# Patient Record
Sex: Male | Born: 1945 | Race: White | Hispanic: No | Smoking: Never smoker
Health system: Southern US, Community
[De-identification: ages and names within clinical notes are randomized; demographics above are authoritative.]

## PROBLEM LIST (undated history)

## (undated) DIAGNOSIS — I10 Essential (primary) hypertension: Secondary | ICD-10-CM

## (undated) DIAGNOSIS — R29818 Other symptoms and signs involving the nervous system: Secondary | ICD-10-CM

## (undated) DIAGNOSIS — Z9289 Personal history of other medical treatment: Secondary | ICD-10-CM

## (undated) DIAGNOSIS — R9431 Abnormal electrocardiogram [ECG] [EKG]: Secondary | ICD-10-CM

## (undated) DIAGNOSIS — R6889 Other general symptoms and signs: Secondary | ICD-10-CM

## (undated) DIAGNOSIS — R3129 Other microscopic hematuria: Secondary | ICD-10-CM

## (undated) DIAGNOSIS — H9319 Tinnitus, unspecified ear: Secondary | ICD-10-CM

## (undated) DIAGNOSIS — N400 Enlarged prostate without lower urinary tract symptoms: Secondary | ICD-10-CM

## (undated) DIAGNOSIS — T884XXA Failed or difficult intubation, initial encounter: Secondary | ICD-10-CM

## (undated) DIAGNOSIS — Z973 Presence of spectacles and contact lenses: Secondary | ICD-10-CM

## (undated) DIAGNOSIS — F4310 Post-traumatic stress disorder, unspecified: Secondary | ICD-10-CM

## (undated) DIAGNOSIS — E7800 Pure hypercholesterolemia, unspecified: Secondary | ICD-10-CM

## (undated) DIAGNOSIS — K219 Gastro-esophageal reflux disease without esophagitis: Secondary | ICD-10-CM

## (undated) DIAGNOSIS — F32A Depression, unspecified: Secondary | ICD-10-CM

## (undated) DIAGNOSIS — F329 Major depressive disorder, single episode, unspecified: Secondary | ICD-10-CM

## (undated) DIAGNOSIS — S069XAA Unspecified intracranial injury with loss of consciousness status unknown, initial encounter: Secondary | ICD-10-CM

## (undated) DIAGNOSIS — M1990 Unspecified osteoarthritis, unspecified site: Secondary | ICD-10-CM

## (undated) DIAGNOSIS — R351 Nocturia: Secondary | ICD-10-CM

## (undated) DIAGNOSIS — J45909 Unspecified asthma, uncomplicated: Secondary | ICD-10-CM

## (undated) DIAGNOSIS — A809 Acute poliomyelitis, unspecified: Secondary | ICD-10-CM

## (undated) DIAGNOSIS — Z85828 Personal history of other malignant neoplasm of skin: Secondary | ICD-10-CM

## (undated) DIAGNOSIS — K5792 Diverticulitis of intestine, part unspecified, without perforation or abscess without bleeding: Secondary | ICD-10-CM

## (undated) DIAGNOSIS — R972 Elevated prostate specific antigen [PSA]: Secondary | ICD-10-CM

## (undated) DIAGNOSIS — S069X9A Unspecified intracranial injury with loss of consciousness of unspecified duration, initial encounter: Secondary | ICD-10-CM

## (undated) DIAGNOSIS — I499 Cardiac arrhythmia, unspecified: Secondary | ICD-10-CM

## (undated) HISTORY — DX: Other microscopic hematuria: R31.29

## (undated) HISTORY — PX: UPPER GASTROINTESTINAL ENDOSCOPY: SHX188

## (undated) HISTORY — DX: Other symptoms and signs involving the nervous system: R29.818

## (undated) HISTORY — DX: Acute poliomyelitis, unspecified: A80.9

## (undated) HISTORY — PX: BASAL CELL CARCINOMA EXCISION: SHX1214

## (undated) HISTORY — PX: CARPAL TUNNEL RELEASE: SHX101

## (undated) HISTORY — PX: CARDIAC CATHETERIZATION: SHX172

## (undated) HISTORY — PX: COLONOSCOPY W/ BIOPSIES: SHX1374

## (undated) HISTORY — PX: TONSILLECTOMY: SUR1361

## (undated) HISTORY — PX: CARDIOVASCULAR STRESS TEST: SHX262

---

## 1983-11-12 HISTORY — PX: CERVICAL LAMINECTOMY: SHX94

## 1984-11-11 HISTORY — PX: CERVICAL FUSION: SHX112

## 1996-11-11 HISTORY — PX: CERVICAL FUSION: SHX112

## 1999-11-26 ENCOUNTER — Encounter: Admission: RE | Admit: 1999-11-26 | Discharge: 1999-11-26 | Payer: Self-pay | Admitting: Neurosurgery

## 1999-11-26 ENCOUNTER — Encounter: Payer: Self-pay | Admitting: Neurosurgery

## 2000-12-03 ENCOUNTER — Ambulatory Visit (HOSPITAL_COMMUNITY): Admission: RE | Admit: 2000-12-03 | Discharge: 2000-12-03 | Payer: Self-pay | Admitting: Interventional Cardiology

## 2006-02-14 ENCOUNTER — Ambulatory Visit (HOSPITAL_COMMUNITY): Admission: RE | Admit: 2006-02-14 | Discharge: 2006-02-14 | Payer: Self-pay | Admitting: Gastroenterology

## 2006-10-16 ENCOUNTER — Encounter: Admission: RE | Admit: 2006-10-16 | Discharge: 2006-12-15 | Payer: Self-pay | Admitting: Internal Medicine

## 2007-01-19 ENCOUNTER — Encounter: Admission: RE | Admit: 2007-01-19 | Discharge: 2007-01-19 | Payer: Self-pay | Admitting: Internal Medicine

## 2008-02-08 ENCOUNTER — Ambulatory Visit (HOSPITAL_COMMUNITY): Admission: RE | Admit: 2008-02-08 | Discharge: 2008-02-08 | Payer: Self-pay | Admitting: Internal Medicine

## 2008-02-26 ENCOUNTER — Ambulatory Visit (HOSPITAL_COMMUNITY): Admission: RE | Admit: 2008-02-26 | Discharge: 2008-02-26 | Payer: Self-pay | Admitting: Internal Medicine

## 2009-02-07 ENCOUNTER — Encounter: Admission: RE | Admit: 2009-02-07 | Discharge: 2009-02-07 | Payer: Self-pay | Admitting: Internal Medicine

## 2009-02-16 ENCOUNTER — Ambulatory Visit (HOSPITAL_COMMUNITY): Admission: RE | Admit: 2009-02-16 | Discharge: 2009-02-16 | Payer: Self-pay | Admitting: Internal Medicine

## 2009-03-27 ENCOUNTER — Ambulatory Visit: Payer: Self-pay | Admitting: Psychology
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
## 2011-05-20 ENCOUNTER — Ambulatory Visit: Payer: Self-pay

## 2011-05-20 ENCOUNTER — Other Ambulatory Visit: Payer: Self-pay | Admitting: Internal Medicine

## 2011-05-20 DIAGNOSIS — R51 Headache: Secondary | ICD-10-CM

## 2011-05-22 ENCOUNTER — Ambulatory Visit
Admission: RE | Admit: 2011-05-22 | Discharge: 2011-05-22 | Disposition: A | Discharge status: Home / Self Care | Payer: Medicare Other | Source: Ambulatory Visit | Attending: Internal Medicine | Admitting: Internal Medicine

## 2011-05-22 DIAGNOSIS — R51 Headache: Secondary | ICD-10-CM

## 2011-08-20 ENCOUNTER — Ambulatory Visit (HOSPITAL_COMMUNITY)
Admission: RE | Admit: 2011-08-20 | Discharge: 2011-08-20 | Disposition: A | Discharge status: Home / Self Care | Payer: Medicare Other | Source: Ambulatory Visit | Attending: Gastroenterology | Admitting: Gastroenterology

## 2011-08-20 DIAGNOSIS — Z981 Arthrodesis status: Secondary | ICD-10-CM | POA: Insufficient documentation

## 2011-08-20 DIAGNOSIS — K219 Gastro-esophageal reflux disease without esophagitis: Secondary | ICD-10-CM | POA: Insufficient documentation

## 2011-08-20 DIAGNOSIS — K5730 Diverticulosis of large intestine without perforation or abscess without bleeding: Secondary | ICD-10-CM | POA: Insufficient documentation

## 2011-08-20 DIAGNOSIS — I10 Essential (primary) hypertension: Secondary | ICD-10-CM | POA: Insufficient documentation

## 2011-08-20 DIAGNOSIS — Z7982 Long term (current) use of aspirin: Secondary | ICD-10-CM | POA: Insufficient documentation

## 2011-08-20 DIAGNOSIS — Z860100 Personal history of colon polyps, unspecified: Secondary | ICD-10-CM | POA: Insufficient documentation

## 2011-08-20 DIAGNOSIS — Z79899 Other long term (current) drug therapy: Secondary | ICD-10-CM | POA: Insufficient documentation

## 2011-08-20 DIAGNOSIS — N400 Enlarged prostate without lower urinary tract symptoms: Secondary | ICD-10-CM | POA: Insufficient documentation

## 2011-08-20 DIAGNOSIS — Z1211 Encounter for screening for malignant neoplasm of colon: Secondary | ICD-10-CM | POA: Insufficient documentation

## 2011-10-24 ENCOUNTER — Other Ambulatory Visit: Payer: Self-pay | Admitting: Internal Medicine

## 2011-10-24 ENCOUNTER — Ambulatory Visit
Admission: RE | Admit: 2011-10-24 | Discharge: 2011-10-24 | Disposition: A | Discharge status: Home / Self Care | Payer: Medicare Other | Source: Ambulatory Visit | Attending: Internal Medicine | Admitting: Internal Medicine

## 2011-10-24 DIAGNOSIS — I2699 Other pulmonary embolism without acute cor pulmonale: Secondary | ICD-10-CM

## 2011-10-24 MED ORDER — IOHEXOL 300 MG/ML  SOLN
125.0000 mL | Freq: Once | INTRAMUSCULAR | Status: AC | PRN
  Administered 2011-10-24: 125 mL via INTRAVENOUS

## 2011-12-26 ENCOUNTER — Other Ambulatory Visit: Payer: Self-pay | Admitting: Internal Medicine

## 2011-12-26 DIAGNOSIS — R630 Anorexia: Secondary | ICD-10-CM

## 2011-12-30 ENCOUNTER — Other Ambulatory Visit: Payer: Self-pay | Admitting: Internal Medicine

## 2011-12-30 ENCOUNTER — Ambulatory Visit
Admission: RE | Admit: 2011-12-30 | Discharge: 2011-12-30 | Disposition: A | Discharge status: Home / Self Care | Payer: Medicare Other | Source: Ambulatory Visit | Attending: Internal Medicine | Admitting: Internal Medicine

## 2011-12-30 DIAGNOSIS — D144 Benign neoplasm of respiratory system, unspecified: Secondary | ICD-10-CM

## 2011-12-30 DIAGNOSIS — R630 Anorexia: Secondary | ICD-10-CM

## 2011-12-30 MED ORDER — IOHEXOL 300 MG/ML  SOLN
100.0000 mL | Freq: Once | INTRAMUSCULAR | Status: AC | PRN
  Administered 2011-12-30: 100 mL via INTRAVENOUS

## 2012-04-29 ENCOUNTER — Ambulatory Visit
Admission: RE | Admit: 2012-04-29 | Discharge: 2012-04-29 | Disposition: A | Discharge status: Home / Self Care | Payer: Medicare Other | Source: Ambulatory Visit | Attending: Internal Medicine | Admitting: Internal Medicine

## 2012-04-29 DIAGNOSIS — D144 Benign neoplasm of respiratory system, unspecified: Secondary | ICD-10-CM

## 2013-01-27 ENCOUNTER — Encounter (HOSPITAL_BASED_OUTPATIENT_CLINIC_OR_DEPARTMENT_OTHER): Payer: Self-pay | Admitting: Anesthesiology

## 2013-01-27 ENCOUNTER — Emergency Department (HOSPITAL_COMMUNITY)
Admission: EM | Admit: 2013-01-27 | Discharge: 2013-01-27 | Disposition: A | Discharge status: Home / Self Care | Payer: Medicare Other | Attending: Emergency Medicine | Admitting: Emergency Medicine

## 2013-01-27 ENCOUNTER — Encounter (HOSPITAL_BASED_OUTPATIENT_CLINIC_OR_DEPARTMENT_OTHER): Payer: Self-pay | Admitting: *Deleted

## 2013-01-27 ENCOUNTER — Other Ambulatory Visit: Payer: Self-pay | Admitting: Orthopedic Surgery

## 2013-01-27 ENCOUNTER — Ambulatory Visit (HOSPITAL_BASED_OUTPATIENT_CLINIC_OR_DEPARTMENT_OTHER): Payer: Medicare Other | Admitting: Anesthesiology

## 2013-01-27 ENCOUNTER — Encounter (HOSPITAL_COMMUNITY): Payer: Self-pay | Admitting: Emergency Medicine

## 2013-01-27 ENCOUNTER — Encounter (HOSPITAL_BASED_OUTPATIENT_CLINIC_OR_DEPARTMENT_OTHER)
Admission: RE | Disposition: A | Discharge status: Home / Self Care | Payer: Self-pay | Source: Ambulatory Visit | Attending: Orthopedic Surgery

## 2013-01-27 ENCOUNTER — Ambulatory Visit (HOSPITAL_BASED_OUTPATIENT_CLINIC_OR_DEPARTMENT_OTHER)
Admission: RE | Admit: 2013-01-27 | Discharge: 2013-01-27 | Disposition: A | Discharge status: Home / Self Care | Payer: Medicare Other | Source: Ambulatory Visit | Attending: Orthopedic Surgery | Admitting: Orthopedic Surgery

## 2013-01-27 DIAGNOSIS — Z981 Arthrodesis status: Secondary | ICD-10-CM | POA: Insufficient documentation

## 2013-01-27 DIAGNOSIS — IMO0001 Reserved for inherently not codable concepts without codable children: Secondary | ICD-10-CM | POA: Insufficient documentation

## 2013-01-27 DIAGNOSIS — Z23 Encounter for immunization: Secondary | ICD-10-CM | POA: Insufficient documentation

## 2013-01-27 DIAGNOSIS — F3289 Other specified depressive episodes: Secondary | ICD-10-CM | POA: Insufficient documentation

## 2013-01-27 DIAGNOSIS — I10 Essential (primary) hypertension: Secondary | ICD-10-CM | POA: Insufficient documentation

## 2013-01-27 DIAGNOSIS — J45909 Unspecified asthma, uncomplicated: Secondary | ICD-10-CM | POA: Insufficient documentation

## 2013-01-27 DIAGNOSIS — S61409A Unspecified open wound of unspecified hand, initial encounter: Secondary | ICD-10-CM | POA: Insufficient documentation

## 2013-01-27 DIAGNOSIS — Z8659 Personal history of other mental and behavioral disorders: Secondary | ICD-10-CM | POA: Insufficient documentation

## 2013-01-27 DIAGNOSIS — K219 Gastro-esophageal reflux disease without esophagitis: Secondary | ICD-10-CM | POA: Insufficient documentation

## 2013-01-27 DIAGNOSIS — F329 Major depressive disorder, single episode, unspecified: Secondary | ICD-10-CM | POA: Insufficient documentation

## 2013-01-27 DIAGNOSIS — Z8709 Personal history of other diseases of the respiratory system: Secondary | ICD-10-CM | POA: Insufficient documentation

## 2013-01-27 DIAGNOSIS — Z8679 Personal history of other diseases of the circulatory system: Secondary | ICD-10-CM | POA: Insufficient documentation

## 2013-01-27 DIAGNOSIS — Z79899 Other long term (current) drug therapy: Secondary | ICD-10-CM | POA: Insufficient documentation

## 2013-01-27 DIAGNOSIS — S61209A Unspecified open wound of unspecified finger without damage to nail, initial encounter: Secondary | ICD-10-CM | POA: Insufficient documentation

## 2013-01-27 DIAGNOSIS — Z7982 Long term (current) use of aspirin: Secondary | ICD-10-CM | POA: Insufficient documentation

## 2013-01-27 HISTORY — DX: Cardiac arrhythmia, unspecified: I49.9

## 2013-01-27 HISTORY — DX: Gastro-esophageal reflux disease without esophagitis: K21.9

## 2013-01-27 HISTORY — DX: Unspecified asthma, uncomplicated: J45.909

## 2013-01-27 HISTORY — DX: Essential (primary) hypertension: I10

## 2013-01-27 HISTORY — DX: Other general symptoms and signs: R68.89

## 2013-01-27 HISTORY — DX: Presence of spectacles and contact lenses: Z97.3

## 2013-01-27 HISTORY — DX: Post-traumatic stress disorder, unspecified: F43.10

## 2013-01-27 HISTORY — DX: Major depressive disorder, single episode, unspecified: F32.9

## 2013-01-27 HISTORY — PX: INCISION AND DRAINAGE ABSCESS: SHX5864

## 2013-01-27 HISTORY — DX: Failed or difficult intubation, initial encounter: T88.4XXA

## 2013-01-27 HISTORY — DX: Depression, unspecified: F32.A

## 2013-01-27 LAB — POCT I-STAT, CHEM 8
BUN: 11 mg/dL (ref 6–23)
Calcium, Ion: 1.25 mmol/L (ref 1.13–1.30)
Hemoglobin: 15.3 g/dL (ref 13.0–17.0)
TCO2: 27 mmol/L (ref 0–100)

## 2013-01-27 SURGERY — INCISION AND DRAINAGE, ABSCESS
Anesthesia: General | Site: Thumb | Laterality: Right | Wound class: Dirty or Infected

## 2013-01-27 MED ORDER — DEXAMETHASONE SODIUM PHOSPHATE 4 MG/ML IJ SOLN
INTRAMUSCULAR | Status: DC | PRN
  Administered 2013-01-27: 10 mg via INTRAVENOUS

## 2013-01-27 MED ORDER — CHLORHEXIDINE GLUCONATE 4 % EX LIQD: 60.0000 mL | Freq: Once | CUTANEOUS | Status: DC

## 2013-01-27 MED ORDER — 0.9 % SODIUM CHLORIDE (POUR BTL) OPTIME
TOPICAL | Status: DC | PRN
  Administered 2013-01-27: 200 mL

## 2013-01-27 MED ORDER — MIDAZOLAM HCL 2 MG/2ML IJ SOLN: 1.0000 mg | INTRAMUSCULAR | Status: DC | PRN

## 2013-01-27 MED ORDER — HYDROMORPHONE HCL PF 1 MG/ML IJ SOLN: 0.2500 mg | INTRAMUSCULAR | Status: DC | PRN

## 2013-01-27 MED ORDER — LIDOCAINE HCL (CARDIAC) 20 MG/ML IV SOLN
INTRAVENOUS | Status: DC | PRN
  Administered 2013-01-27: 50 mg via INTRAVENOUS

## 2013-01-27 MED ORDER — HYDROCODONE-ACETAMINOPHEN 5-325 MG PO TABS: 1.0000 | ORAL_TABLET | Freq: Four times a day (QID) | ORAL | Status: DC | PRN

## 2013-01-27 MED ORDER — SUFENTANIL CITRATE 50 MCG/ML IV SOLN
INTRAVENOUS | Status: DC | PRN
  Administered 2013-01-27: 10 ug via INTRAVENOUS

## 2013-01-27 MED ORDER — ONDANSETRON HCL 4 MG/2ML IJ SOLN: 4.0000 mg | Freq: Once | INTRAMUSCULAR | Status: DC | PRN

## 2013-01-27 MED ORDER — ONDANSETRON HCL 4 MG/2ML IJ SOLN
INTRAMUSCULAR | Status: DC | PRN
  Administered 2013-01-27: 4 mg via INTRAVENOUS

## 2013-01-27 MED ORDER — BUPIVACAINE HCL (PF) 0.25 % IJ SOLN
INTRAMUSCULAR | Status: DC | PRN
  Administered 2013-01-27: 8.5 mL

## 2013-01-27 MED ORDER — OXYCODONE HCL 5 MG PO TABS: 5.0000 mg | ORAL_TABLET | Freq: Once | ORAL | Status: DC | PRN

## 2013-01-27 MED ORDER — PROPOFOL 10 MG/ML IV BOLUS
INTRAVENOUS | Status: DC | PRN
  Administered 2013-01-27: 300 mg via INTRAVENOUS

## 2013-01-27 MED ORDER — CEFAZOLIN SODIUM-DEXTROSE 2-3 GM-% IV SOLR: 2.0000 g | INTRAVENOUS | Status: DC

## 2013-01-27 MED ORDER — EPHEDRINE SULFATE 50 MG/ML IJ SOLN
INTRAMUSCULAR | Status: DC | PRN
  Administered 2013-01-27: 10 mg via INTRAVENOUS

## 2013-01-27 MED ORDER — RABIES VACCINE, PCEC IM SUSR
1.0000 mL | Freq: Once | INTRAMUSCULAR | Status: AC
  Administered 2013-01-27: 1 mL via INTRAMUSCULAR
  Filled 2013-01-27: qty 1

## 2013-01-27 MED ORDER — FENTANYL CITRATE 0.05 MG/ML IJ SOLN: 50.0000 ug | INTRAMUSCULAR | Status: DC | PRN

## 2013-01-27 MED ORDER — RABIES IMMUNE GLOBULIN 150 UNIT/ML IM INJ
20.0000 [IU]/kg | INJECTION | Freq: Once | INTRAMUSCULAR | Status: AC
  Administered 2013-01-27: 1800 [IU] via INTRAMUSCULAR
  Filled 2013-01-27: qty 12

## 2013-01-27 MED ORDER — AMOXICILLIN-POT CLAVULANATE 875-125 MG PO TABS: 1.0000 | ORAL_TABLET | Freq: Two times a day (BID) | ORAL | Status: DC

## 2013-01-27 MED ORDER — OXYCODONE HCL 5 MG/5ML PO SOLN: 5.0000 mg | Freq: Once | ORAL | Status: DC | PRN

## 2013-01-27 MED ORDER — LACTATED RINGERS IV SOLN
INTRAVENOUS | Status: DC
  Administered 2013-01-27 (×2): via INTRAVENOUS

## 2013-01-27 MED ORDER — SODIUM CHLORIDE 0.9 % IV SOLN
3.0000 g | Freq: Once | INTRAVENOUS | Status: AC
  Administered 2013-01-27: 3 g via INTRAVENOUS

## 2013-01-27 MED ORDER — MIDAZOLAM HCL 5 MG/5ML IJ SOLN
INTRAMUSCULAR | Status: DC | PRN
  Administered 2013-01-27: 2 mg via INTRAVENOUS

## 2013-01-27 SURGICAL SUPPLY — 49 items
BAG DECANTER FOR FLEXI CONT (MISCELLANEOUS) IMPLANT
BANDAGE GAUZE ELAST BULKY 4 IN (GAUZE/BANDAGES/DRESSINGS) ×1 IMPLANT
BLADE MINI RND TIP GREEN BEAV (BLADE) IMPLANT
BLADE SURG 15 STRL LF DISP TIS (BLADE) ×1 IMPLANT
BLADE SURG 15 STRL SS (BLADE) ×2
BNDG CMPR 9X4 STRL LF SNTH (GAUZE/BANDAGES/DRESSINGS) ×1
BNDG COHESIVE 1X5 TAN STRL LF (GAUZE/BANDAGES/DRESSINGS) IMPLANT
BNDG COHESIVE 3X5 TAN STRL LF (GAUZE/BANDAGES/DRESSINGS) ×1 IMPLANT
BNDG ESMARK 4X9 LF (GAUZE/BANDAGES/DRESSINGS) ×1 IMPLANT
CHLORAPREP W/TINT 26ML (MISCELLANEOUS) ×2 IMPLANT
CLOTH BEACON ORANGE TIMEOUT ST (SAFETY) ×2 IMPLANT
CORDS BIPOLAR (ELECTRODE) ×2 IMPLANT
COVER MAYO STAND STRL (DRAPES) ×2 IMPLANT
COVER TABLE BACK 60X90 (DRAPES) ×2 IMPLANT
CUFF TOURNIQUET SINGLE 18IN (TOURNIQUET CUFF) ×1 IMPLANT
DRAPE EXTREMITY T 121X128X90 (DRAPE) ×2 IMPLANT
DRAPE SURG 17X23 STRL (DRAPES) ×2 IMPLANT
DRSG KUZMA FLUFF (GAUZE/BANDAGES/DRESSINGS) ×2 IMPLANT
GAUZE PACKING IODOFORM 1/4X5 (PACKING) ×1 IMPLANT
GAUZE XEROFORM 1X8 LF (GAUZE/BANDAGES/DRESSINGS) ×2 IMPLANT
GLOVE BIO SURGEON STRL SZ 6.5 (GLOVE) ×2 IMPLANT
GLOVE BIOGEL PI IND STRL 6.5 (GLOVE) IMPLANT
GLOVE BIOGEL PI IND STRL 8.5 (GLOVE) ×1 IMPLANT
GLOVE BIOGEL PI INDICATOR 6.5 (GLOVE) ×1
GLOVE BIOGEL PI INDICATOR 8.5 (GLOVE) ×1
GLOVE SURG ORTHO 8.0 STRL STRW (GLOVE) ×2 IMPLANT
GOWN BRE IMP PREV XXLGXLNG (GOWN DISPOSABLE) ×2 IMPLANT
GOWN PREVENTION PLUS XLARGE (GOWN DISPOSABLE) ×2 IMPLANT
LOOP VESSEL MAXI BLUE (MISCELLANEOUS) IMPLANT
NEEDLE 27GAX1X1/2 (NEEDLE) ×1 IMPLANT
NS IRRIG 1000ML POUR BTL (IV SOLUTION) ×2 IMPLANT
PACK BASIN DAY SURGERY FS (CUSTOM PROCEDURE TRAY) ×2 IMPLANT
PAD CAST 3X4 CTTN HI CHSV (CAST SUPPLIES) IMPLANT
PADDING CAST ABS 4INX4YD NS (CAST SUPPLIES) ×1
PADDING CAST ABS COTTON 4X4 ST (CAST SUPPLIES) ×1 IMPLANT
PADDING CAST COTTON 3X4 STRL (CAST SUPPLIES) ×2
SPLINT PLASTER CAST XFAST 3X15 (CAST SUPPLIES) IMPLANT
SPLINT PLASTER XTRA FASTSET 3X (CAST SUPPLIES)
SPONGE GAUZE 4X4 12PLY (GAUZE/BANDAGES/DRESSINGS) ×2 IMPLANT
STOCKINETTE 4X48 STRL (DRAPES) ×2 IMPLANT
SUT VICRYL RAPID 5 0 P 3 (SUTURE) IMPLANT
SUT VICRYL RAPIDE 4/0 PS 2 (SUTURE) ×1 IMPLANT
SWAB COLLECTION DEVICE MRSA (MISCELLANEOUS) ×1 IMPLANT
SYR BULB 3OZ (MISCELLANEOUS) ×2 IMPLANT
SYR CONTROL 10ML LL (SYRINGE) ×1 IMPLANT
TOWEL OR 17X24 6PK STRL BLUE (TOWEL DISPOSABLE) ×2 IMPLANT
TUBE ANAEROBIC SPECIMEN COL (MISCELLANEOUS) ×1 IMPLANT
TUBE FEEDING 5FR 15 INCH (TUBING) IMPLANT
UNDERPAD 30X30 INCONTINENT (UNDERPADS AND DIAPERS) ×2 IMPLANT

## 2013-01-27 NOTE — Anesthesia Postprocedure Evaluation (Signed)
  Anesthesia Post-op Note  Patient: Keith [name]  Procedure(s) Performed: Procedure(s): INCISION AND DRAINAGE ABSCESS (Right)  Patient Location: PACU  Anesthesia Type:General  Level of Consciousness: awake, alert  and oriented  Airway and Oxygen Therapy: Patient Spontanous Breathing  Post-op Pain: mild  Post-op Assessment: Post-op Vital signs reviewed  Post-op Vital Signs: Reviewed  Complications: No apparent anesthesia complications

## 2013-01-27 NOTE — Brief Op Note (Signed)
01/27/2013  4:06 PM  PATIENT:  Keith [name]  67 y.o. male  PRE-OPERATIVE DIAGNOSIS:  Infection right thumb  POST-OPERATIVE DIAGNOSIS:  Infection right thumb  PROCEDURE:  Procedure(s): INCISION AND DRAINAGE ABSCESS (Right)  SURGEON:  Surgeon(s) and Role:    * Nicki Reaper, MD - Primary  PHYSICIAN ASSISTANT:   ASSISTANTS: none   ANESTHESIA:   local and general  EBL:  Total I/O In: 1500 [I.V.:1500] Out: -   BLOOD ADMINISTERED:none  DRAINS: none   LOCAL MEDICATIONS USED:  MARCAINE     SPECIMEN:  Source of Specimen:  culture  DISPOSITION OF SPECIMEN:  PATHOLOGY  COUNTS:  YES  TOURNIQUET:   Total Tourniquet Time Documented: Upper Arm (Right) - 17 minutes Total: Upper Arm (Right) - 17 minutes   DICTATION: .Other Dictation: Dictation Number [id number]  PLAN OF CARE: Discharge to home after PACU  PATIENT DISPOSITION:  PACU - hemodynamically stable.

## 2013-01-27 NOTE — ED Notes (Signed)
Patient just released from the surgery center from having a wound debridement of his right thumb from a cat bite, surgical dressing intact

## 2013-01-27 NOTE — ED Notes (Addendum)
Pt states he was bit on R hand by a stray cat yesterday.  Pt is keeping the stray cat in his house.  The cat is pregnant and bit him yesterday when he attempted to move her.  Pt was just discharged from [facility] Day surgery after having hand surgery s/p cat bite and was advised to come to ED for rabies injections.

## 2013-01-27 NOTE — Op Note (Signed)
  Dictation Number [id number]

## 2013-01-27 NOTE — ED Provider Notes (Signed)
History    This chart was scribed for non-physician practitioner working with Raeford Razor, MD by ED Scribe, Burman Nieves. This patient was seen in room TR11C/TR11C and the patient's care was started at 7:51 PM.   CSN: [id number]  Arrival date & time 01/27/13  1755   First MD Initiated Contact with Patient 01/27/13 1951      Chief Complaint  Patient presents with  . Rabies Injection    (Consider location/radiation/quality/duration/timing/severity/associated sxs/prior treatment) The history is provided by the patient. No language interpreter was used.   Keith [name] is a 67 y.o. male who presents to the Emergency Department complaining of risk of rabies from a stray cat on right hand onset yesterday. Pt was bitten by a pregnant stray cat when pt attempted to move her. Pt is currently keeping the stray cat in house. States that he does not want the cat to be put down if he does not have to. Pt was discharged from [facility] yesterday after surgery (surgery done by Dr. Merlyn Lot) and was advised to come into ED today for rabies injection. Unable to fully examine due to bulky dressing applied after surgery by Dr. Merlyn Lot. Pt denies trouble swallowing or breathing, fever, chills, cough, nausea, vomiting, diarrhea, SOB, weakness, and any other associated symptoms. UTD with Tetanus shot. Pt's PCP is Dr. Particia Lather     Past Medical History  Diagnosis Date  . Hypertension   . Dysrhythmia     hx wide comlex tach during a stress test 2002-dr smith  . Depression   . Asthma     as child  . Wears glasses   . Headache   . GERD (gastroesophageal reflux disease)     hx   . Neck problem     has had 2 cervical surgeries with fusion-limited neck mobility  . Difficult intubation     very limited neck mobility post op fusion  . PTSD (post-traumatic stress disorder)     Past Surgical History  Procedure Laterality Date  . Cervical laminectomy  1985  . Cervical fusion  1998  . Tonsillectomy    . Carpal  tunnel release      both rt and lt  . Upper gastrointestinal endoscopy    . Colonoscopy w/ biopsies      No family history on file.  History  Substance Use Topics  . Smoking status: Never Smoker   . Smokeless tobacco: Not on file  . Alcohol Use: No      Review of Systems  All other systems reviewed and are negative.    Allergies  Review of patient's allergies indicates no known allergies.  Home Medications   Current Outpatient Rx  Name  Route  Sig  Dispense  Refill  . amLODipine (NORVASC) 10 MG tablet   Oral   Take 10 mg by mouth daily.         Marland Kitchen aspirin EC 81 MG tablet   Oral   Take 81 mg by mouth daily.         Marland Kitchen buPROPion (WELLBUTRIN XL) 150 MG 24 hr tablet   Oral   Take 150 mg by mouth daily.         Marland Kitchen buPROPion (WELLBUTRIN XL) 300 MG 24 hr tablet   Oral   Take 300 mg by mouth daily.         . cholecalciferol (VITAMIN D) 1000 UNITS tablet   Oral   Take 2,000 Units by mouth daily.          Marland Kitchen  folic acid (FOLVITE) 800 MCG tablet   Oral   Take 800 mcg by mouth daily.          . metoprolol succinate (TOPROL-XL) 100 MG 24 hr tablet   Oral   Take 100 mg by mouth daily. Take with or immediately following a meal.         . olmesartan-hydrochlorothiazide (BENICAR HCT) 40-12.5 MG per tablet   Oral   Take 1 tablet by mouth daily.           BP 126/65  Pulse 54  Temp(Src) 98.1 F (36.7 C) (Oral)  Resp 16  SpO2 95%  Physical Exam  Nursing note and vitals reviewed. Constitutional: He is oriented to person, place, and time. He appears well-developed and well-nourished. No distress.  HENT:  Head: Normocephalic and atraumatic.  Eyes: EOM are normal.  Neck: Neck supple. No tracheal deviation present.  Cardiovascular: Normal rate.   Pulmonary/Chest: Effort normal. No respiratory distress.  Musculoskeletal: Normal range of motion.  R arm with bulky dressing throughout length of arm, limited full evaluation.  Brisk cap refills at distal  finger tips.  Neurological: He is alert and oriented to person, place, and time.  Skin: Skin is warm and dry.  Psychiatric: He has a normal mood and affect. His behavior is normal.    ED Course  Procedures (including critical care time) DIAGNOSTIC STUDIES: Oxygen Saturation is 95% on room air, adequate by my interpretation.    COORDINATION OF CARE: 8:34 PM Discussed ED treatment with pt and pt agrees.     Labs Reviewed - No data to display No results found.   1. Rabies, need for prophylactic vaccination against     BP 125/65  Pulse 65  Temp(Src) 98 F (36.7 C) (Oral)  Resp 18  Wt 195 lb 15.8 oz (88.9 kg)  BMI 28.12 kg/m2  SpO2 96%   MDM  Pt is here for rabies prophylaxis as recommended by his hand specialist Dr. Merlyn Lot.  Rabies vaccine and immunoglobulin given here with scheduled f/u at Urgent Care for the remainder shots.  No hx of immunocompromise, is UTD with immunization.  Stray cat is a cat he kept at home and pt will contact his veternarian for further management of the cat.  No animal control was involved as per pt's request.  Return precaution discussed   I personally performed the services described in this documentation, which was scribed in my presence. The recorded information has been reviewed and is accurate.         Fayrene Helper, PA-C 01/27/13 2347

## 2013-01-27 NOTE — H&P (Signed)
  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 He suffered a cat bite to his right thumb MCP joint on 01/26/2013. He has had progressive swelling and erythema.  The cat is a Estate manager/land agent with kittens. He has no prior injury.  PMH: Allergy: none  Meds: norvasc, wellbutrin, folvite, Toprol XL, benicar, ASA  Surgery: cervical fusion.CTR's   FH: Neg SH:  Smoke: none   ETOH: Neg ROS: high blood pressure,  Keith [name] is an 67 y.o. male.   Chief Complaint: cat bite MCP RT thumb HPI: see above  Past Medical History  Diagnosis Date  . Hypertension   . Dysrhythmia     hx wide comlex tach during a stress test 2002-dr smith  . Depression   . Asthma     as child  . Wears glasses   . Headache   . GERD (gastroesophageal reflux disease)     hx   . Neck problem     has had 2 cervical surgeries with fusion-limited neck mobility  . Difficult intubation     very limited neck mobility post op fusion    Past Surgical History  Procedure Laterality Date  . Cervical laminectomy  1985  . Cervical fusion  1998  . Tonsillectomy    . Carpal tunnel release      both rt and lt  . Upper gastrointestinal endoscopy    . Colonoscopy w/ biopsies      History reviewed. No pertinent family history. Social History:  reports that he has never smoked. He does not have any smokeless tobacco history on file. He reports that he does not drink alcohol or use illicit drugs.  Allergies: No Known Allergies  No prescriptions prior to admission    No results found for this or any previous visit (from the past 48 hour(s)).  No results found.   Pertinent items are noted in HPI.  Height 5\' 10"  (1.778 m), weight 86.183 kg (190 lb).  General appearance: cooperative and appears stated age Head: Normocephalic, without obvious abnormality Neck: no JVD Resp: clear to auscultation bilaterally Cardio: regular rate and rhythm, S1, S2 normal, no murmur, click, rub or gallop GI: soft, non-tender; bowel sounds  normal; no masses,  no organomegaly Extremities: extremities normal, atraumatic, no cyanosis or edema Pulses: 2+ and symmetric Skin: Skin color, texture, turgor normal. No rashes or lesions Neurologic: Grossly normal Incision/Wound: Bite dorsal and volar MCP Rt thumb  Assessment/Plan I&D Rt thumb  Danya Spearman R 01/27/2013, 12:31 PM

## 2013-01-27 NOTE — Anesthesia Preprocedure Evaluation (Signed)
Anesthesia Evaluation  Patient identified by MRN, date of birth, ID band Patient awake    Reviewed: Allergy & Precautions, H&P , NPO status , Patient's Chart, lab work & pertinent test results  History of Anesthesia Complications (+) DIFFICULT AIRWAY  Airway Mallampati: II TM Distance: >3 FB Neck ROM: Limited    Dental  (+) Teeth Intact and Dental Advisory Given   Pulmonary  breath sounds clear to auscultation        Cardiovascular hypertension, Pt. on medications and Pt. on home beta blockers Rhythm:Regular Rate:Normal     Neuro/Psych    GI/Hepatic GERD-  Medicated and Controlled,  Endo/Other    Renal/GU      Musculoskeletal   Abdominal   Peds  Hematology   Anesthesia Other Findings   Reproductive/Obstetrics                           Anesthesia Physical Anesthesia Plan  ASA: II  Anesthesia Plan: General   Post-op Pain Management:    Induction: Intravenous  Airway Management Planned: LMA  Additional Equipment:   Intra-op Plan:   Post-operative Plan: Extubation in OR  Informed Consent: I have reviewed the patients History and Physical, chart, labs and discussed the procedure including the risks, benefits and alternatives for the proposed anesthesia with the patient or authorized representative who has indicated his/her understanding and acceptance.   Dental advisory given  Plan Discussed with: CRNA, Anesthesiologist and Surgeon  Anesthesia Plan Comments:         Anesthesia Quick Evaluation

## 2013-01-27 NOTE — Progress Notes (Signed)
Pt will need ekg and istat Had hx arrythmia-not had to see dr Katrinka Blazing in several yr

## 2013-01-27 NOTE — Anesthesia Procedure Notes (Signed)
Procedure Name: LMA Insertion Date/Time: 01/27/2013 3:32 PM Performed by: Zenia Resides D Pre-anesthesia Checklist: Patient identified, Emergency Drugs available, Suction available and Patient being monitored Patient Re-evaluated:Patient Re-evaluated prior to inductionOxygen Delivery Method: Circle System Utilized Preoxygenation: Pre-oxygenation with 100% oxygen Intubation Type: IV induction Ventilation: Mask ventilation without difficulty LMA: LMA inserted LMA Size: 5.0 Number of attempts: 1 Airway Equipment and Method: bite block Placement Confirmation: positive ETCO2 and breath sounds checked- equal and bilateral Tube secured with: Tape Dental Injury: Teeth and Oropharynx as per pre-operative assessment

## 2013-01-27 NOTE — Transfer of Care (Signed)
Immediate Anesthesia Transfer of Care Note  Patient: Keith [name]  Procedure(s) Performed: Procedure(s): INCISION AND DRAINAGE ABSCESS (Right)  Patient Location: PACU  Anesthesia Type:General  Level of Consciousness: awake  Airway & Oxygen Therapy: Patient Spontanous Breathing and Patient connected to face mask oxygen  Post-op Assessment: Report given to PACU RN and Post -op Vital signs reviewed and stable  Post vital signs: Reviewed and stable  Complications: No apparent anesthesia complications

## 2013-01-28 ENCOUNTER — Encounter (HOSPITAL_BASED_OUTPATIENT_CLINIC_OR_DEPARTMENT_OTHER): Payer: Self-pay | Admitting: Orthopedic Surgery

## 2013-01-28 NOTE — ED Provider Notes (Signed)
Medical screening examination/treatment/procedure(s) were performed by non-physician practitioner and as supervising physician I was immediately available for consultation/collaboration.  Lashena Signer, MD 01/28/13 1418 

## 2013-01-30 ENCOUNTER — Emergency Department (INDEPENDENT_AMBULATORY_CARE_PROVIDER_SITE_OTHER)
Admission: EM | Admit: 2013-01-30 | Discharge: 2013-01-30 | Disposition: A | Discharge status: Home / Self Care | Payer: Medicare Other | Source: Home / Self Care

## 2013-01-30 ENCOUNTER — Encounter (HOSPITAL_COMMUNITY): Payer: Self-pay | Admitting: Emergency Medicine

## 2013-01-30 DIAGNOSIS — Z203 Contact with and (suspected) exposure to rabies: Secondary | ICD-10-CM

## 2013-01-30 LAB — CULTURE, ROUTINE-ABSCESS

## 2013-01-30 MED ORDER — RABIES VACCINE, PCEC IM SUSR
1.0000 mL | Freq: Once | INTRAMUSCULAR | Status: AC
  Administered 2013-01-30: 1 mL via INTRAMUSCULAR

## 2013-01-30 MED ORDER — RABIES VACCINE, PCEC IM SUSR
INTRAMUSCULAR | Status: AC
  Filled 2013-01-30: qty 1

## 2013-01-30 NOTE — ED Notes (Signed)
Pt is here for 2nd rabies shot Denies any new medical problems  He is alert and oriented w/no signs of acute distress.

## 2013-02-01 LAB — ANAEROBIC CULTURE

## 2013-02-01 NOTE — Op Note (Signed)
NAME:  Keith [name], Keith [name] NO.:  [id number]  MEDICAL RECORD NO.:  [id number]  LOCATION:                                 FACILITY:  PHYSICIAN:  Cindee Salt, M.D.       DATE OF BIRTH:  01-13-1946  DATE OF PROCEDURE:  01/27/2013 DATE OF DISCHARGE:                              OPERATIVE REPORT   PREOPERATIVE DIAGNOSIS:  Cat bite metacarpophalangeal joint, right thumb.  POSTOPERATIVE DIAGNOSIS:  Cat bite metacarpophalangeal joint, right thumb.  OPERATION:  Incision and drainage, cat bite right thumb.  SURGEON:  Cindee Salt, MD  ANESTHESIA:  General with wrist block.  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  He has erythema and pain at the metacarpophalangeal joint of his right thumb with the dorsal palmar wound.  Recommended I and D with injury into the metacarpophalangeal joint.  He will also be set up for rabies vaccination and that the cat was unknown with respect to rabies prophylaxis.  He is aware of risks and complications that this will be an open wound allowed to heal in secondarily.  He will be placed on antibiotics in addition to pain medicine postoperatively.  In preoperative area, the patient is seen, the extremity marked by both the patient and surgeon.  PROCEDURE:  The patient was brought to the operating room where a general anesthetic was carried out without difficulty.  He was prepped using ChloraPrep, supine position with the right arm free.  A 3-minute dry time was allowed.  Time-out taken, confirming patient and procedure. The limb was exsanguinated from the wrist proximally with an Esmarch bandage.  Tourniquet placed on the upper arm was inflated to 250 mmHg. The dorsal wound was opened first.  A longitudinal incision made directly over the bite carried down through subcutaneous tissue.  The bite went between the EPL and EPB into the  metacarpophalangeal joint. This was opened.  Cultures were taken for both aerobic and anaerobic cultures.  Separate incision was then made on the palmar aspect of the thumb, again carried down through subcutaneous tissue.  Neurovascular bundles were identified and protected.  The dissection carried down to the base of the proximal phalanx with the bite going down to the proximal phalanx.  Aerobic cultures were taken along with a portion of fat which appeared to be extremely hyperemic and showing changes indicative of necrosis.  The wounds were then copiously irrigated with saline.  Each was packed open with iodoform gauze.  Sterile compressive dressing and splint was applied to the thumb.  The patient tolerated the procedure well, and on deflation the fingers immediately pinked.  He was taken to the recovery room for observation in satisfactory condition.  He was given 3 g of Unasyn.  He will be discharged on hydrocodone and Augmentin to return in 2 days.  He is sent to the emergency room for rabies prophylaxis.          ______________________________ Cindee Salt, M.D.     GK/MEDQ  D:  01/27/2013  T:  01/27/2013  Job:  [id number]

## 2013-02-03 ENCOUNTER — Encounter (HOSPITAL_COMMUNITY): Payer: Self-pay

## 2013-02-03 ENCOUNTER — Emergency Department (INDEPENDENT_AMBULATORY_CARE_PROVIDER_SITE_OTHER)
Admission: EM | Admit: 2013-02-03 | Discharge: 2013-02-03 | Disposition: A | Discharge status: Home / Self Care | Payer: Medicare Other | Source: Home / Self Care

## 2013-02-03 DIAGNOSIS — Z203 Contact with and (suspected) exposure to rabies: Secondary | ICD-10-CM

## 2013-02-03 MED ORDER — RABIES VACCINE, PCEC IM SUSR
1.0000 mL | Freq: Once | INTRAMUSCULAR | Status: AC
  Administered 2013-02-03: 1 mL via INTRAMUSCULAR

## 2013-02-03 MED ORDER — RABIES VACCINE, PCEC IM SUSR
INTRAMUSCULAR | Status: AC
  Filled 2013-02-03: qty 1

## 2013-02-03 NOTE — ED Notes (Signed)
Here for rabies shot # day 7 in series; wound attended by outside office Dr Merlyn Lot; NAD

## 2013-02-10 ENCOUNTER — Encounter (HOSPITAL_COMMUNITY): Payer: Self-pay | Admitting: Emergency Medicine

## 2013-02-10 ENCOUNTER — Emergency Department (INDEPENDENT_AMBULATORY_CARE_PROVIDER_SITE_OTHER)
Admission: EM | Admit: 2013-02-10 | Discharge: 2013-02-10 | Disposition: A | Discharge status: Home / Self Care | Payer: Medicare Other | Source: Home / Self Care

## 2013-02-10 DIAGNOSIS — Z23 Encounter for immunization: Secondary | ICD-10-CM

## 2013-02-10 MED ORDER — RABIES VACCINE, PCEC IM SUSR
INTRAMUSCULAR | Status: AC
  Filled 2013-02-10: qty 1

## 2013-02-10 MED ORDER — RABIES VACCINE, PCEC IM SUSR
1.0000 mL | Freq: Once | INTRAMUSCULAR | Status: AC
  Administered 2013-02-10: 1 mL via INTRAMUSCULAR

## 2013-02-10 NOTE — ED Notes (Signed)
Pt is here for the 4th rabies vaccination Denies any new medical prob  He is alert and oriented w/no signs of acute distress.

## 2013-08-10 ENCOUNTER — Other Ambulatory Visit (HOSPITAL_COMMUNITY): Payer: Self-pay | Admitting: Family Medicine

## 2013-08-10 DIAGNOSIS — R0989 Other specified symptoms and signs involving the circulatory and respiratory systems: Secondary | ICD-10-CM

## 2013-08-12 ENCOUNTER — Ambulatory Visit (HOSPITAL_COMMUNITY)
Admission: RE | Admit: 2013-08-12 | Discharge: 2013-08-12 | Disposition: A | Discharge status: Home / Self Care | Payer: Medicare Other | Source: Ambulatory Visit | Attending: Vascular Surgery | Admitting: Vascular Surgery

## 2013-08-12 DIAGNOSIS — R0989 Other specified symptoms and signs involving the circulatory and respiratory systems: Secondary | ICD-10-CM | POA: Insufficient documentation

## 2013-10-13 ENCOUNTER — Other Ambulatory Visit: Payer: Self-pay | Admitting: Internal Medicine

## 2013-10-13 ENCOUNTER — Ambulatory Visit
Admission: RE | Admit: 2013-10-13 | Discharge: 2013-10-13 | Disposition: A | Discharge status: Home / Self Care | Payer: Medicare Other | Source: Ambulatory Visit | Attending: Internal Medicine | Admitting: Internal Medicine

## 2013-10-13 DIAGNOSIS — R071 Chest pain on breathing: Secondary | ICD-10-CM

## 2014-10-16 ENCOUNTER — Encounter: Payer: Self-pay | Admitting: *Deleted

## 2014-12-09 ENCOUNTER — Other Ambulatory Visit: Payer: Self-pay | Admitting: Gastroenterology

## 2014-12-09 DIAGNOSIS — R1310 Dysphagia, unspecified: Secondary | ICD-10-CM

## 2014-12-12 ENCOUNTER — Ambulatory Visit
Admission: RE | Admit: 2014-12-12 | Discharge: 2014-12-12 | Disposition: A | Discharge status: Home / Self Care | Payer: Medicare Other | Source: Ambulatory Visit | Attending: Gastroenterology | Admitting: Gastroenterology

## 2014-12-12 DIAGNOSIS — R1310 Dysphagia, unspecified: Secondary | ICD-10-CM

## 2015-01-23 ENCOUNTER — Encounter (HOSPITAL_COMMUNITY): Payer: Self-pay | Admitting: *Deleted

## 2015-01-25 LAB — HM DEXA SCAN

## 2015-01-30 ENCOUNTER — Other Ambulatory Visit: Payer: Self-pay | Admitting: Gastroenterology

## 2015-02-02 ENCOUNTER — Ambulatory Visit (HOSPITAL_COMMUNITY)
Admission: RE | Admit: 2015-02-02 | Discharge: 2015-02-02 | Disposition: A | Discharge status: Home / Self Care | Payer: Medicare Other | Source: Ambulatory Visit | Attending: Gastroenterology | Admitting: Gastroenterology

## 2015-02-02 ENCOUNTER — Ambulatory Visit (HOSPITAL_COMMUNITY): Payer: Medicare Other | Admitting: Anesthesiology

## 2015-02-02 ENCOUNTER — Encounter (HOSPITAL_COMMUNITY)
Admission: RE | Disposition: A | Discharge status: Home / Self Care | Payer: Self-pay | Source: Ambulatory Visit | Attending: Gastroenterology

## 2015-02-02 ENCOUNTER — Encounter (HOSPITAL_COMMUNITY): Payer: Self-pay | Admitting: Anesthesiology

## 2015-02-02 DIAGNOSIS — R1310 Dysphagia, unspecified: Secondary | ICD-10-CM | POA: Diagnosis present

## 2015-02-02 DIAGNOSIS — I10 Essential (primary) hypertension: Secondary | ICD-10-CM | POA: Insufficient documentation

## 2015-02-02 DIAGNOSIS — F4310 Post-traumatic stress disorder, unspecified: Secondary | ICD-10-CM | POA: Diagnosis not present

## 2015-02-02 DIAGNOSIS — K222 Esophageal obstruction: Secondary | ICD-10-CM | POA: Diagnosis not present

## 2015-02-02 DIAGNOSIS — J45909 Unspecified asthma, uncomplicated: Secondary | ICD-10-CM | POA: Diagnosis not present

## 2015-02-02 DIAGNOSIS — Z7982 Long term (current) use of aspirin: Secondary | ICD-10-CM | POA: Diagnosis not present

## 2015-02-02 DIAGNOSIS — Z791 Long term (current) use of non-steroidal anti-inflammatories (NSAID): Secondary | ICD-10-CM | POA: Insufficient documentation

## 2015-02-02 DIAGNOSIS — F329 Major depressive disorder, single episode, unspecified: Secondary | ICD-10-CM | POA: Insufficient documentation

## 2015-02-02 DIAGNOSIS — K219 Gastro-esophageal reflux disease without esophagitis: Secondary | ICD-10-CM | POA: Insufficient documentation

## 2015-02-02 DIAGNOSIS — K449 Diaphragmatic hernia without obstruction or gangrene: Secondary | ICD-10-CM | POA: Insufficient documentation

## 2015-02-02 DIAGNOSIS — Z79899 Other long term (current) drug therapy: Secondary | ICD-10-CM | POA: Insufficient documentation

## 2015-02-02 HISTORY — PX: ESOPHAGOGASTRODUODENOSCOPY (EGD) WITH PROPOFOL: SHX5813

## 2015-02-02 SURGERY — ESOPHAGOGASTRODUODENOSCOPY (EGD) WITH PROPOFOL
Anesthesia: Monitor Anesthesia Care

## 2015-02-02 MED ORDER — PROPOFOL INFUSION 10 MG/ML OPTIME
INTRAVENOUS | Status: DC | PRN
  Administered 2015-02-02: 300 ug/kg/min via INTRAVENOUS

## 2015-02-02 MED ORDER — ONDANSETRON HCL 4 MG/2ML IJ SOLN
INTRAMUSCULAR | Status: AC
  Filled 2015-02-02: qty 2

## 2015-02-02 MED ORDER — LIDOCAINE HCL (CARDIAC) 20 MG/ML IV SOLN
INTRAVENOUS | Status: DC | PRN
  Administered 2015-02-02: 100 mg via INTRAVENOUS

## 2015-02-02 MED ORDER — SODIUM CHLORIDE 0.9 % IV SOLN: INTRAVENOUS | Status: DC

## 2015-02-02 MED ORDER — ONDANSETRON HCL 4 MG/2ML IJ SOLN
INTRAMUSCULAR | Status: DC | PRN
  Administered 2015-02-02: 4 mg via INTRAVENOUS

## 2015-02-02 MED ORDER — LACTATED RINGERS IV SOLN
INTRAVENOUS | Status: DC | PRN
  Administered 2015-02-02: 10:00:00 via INTRAVENOUS

## 2015-02-02 MED ORDER — LIDOCAINE HCL (CARDIAC) 20 MG/ML IV SOLN
INTRAVENOUS | Status: AC
  Filled 2015-02-02: qty 5

## 2015-02-02 MED ORDER — KETAMINE HCL 10 MG/ML IJ SOLN
INTRAMUSCULAR | Status: DC | PRN
  Administered 2015-02-02: 20 mg via INTRAVENOUS

## 2015-02-02 MED ORDER — PROPOFOL 10 MG/ML IV BOLUS
INTRAVENOUS | Status: AC
  Filled 2015-02-02: qty 20

## 2015-02-02 SURGICAL SUPPLY — 15 items

## 2015-02-02 NOTE — Discharge Instructions (Signed)
Esophagogastroduodenoscopy °Care After °Refer to this sheet in the next few weeks. These instructions provide you with information on caring for yourself after your procedure. Your caregiver may also give you more specific instructions. Your treatment has been planned according to current medical practices, but problems sometimes occur. Call your caregiver if you have any problems or questions after your procedure.  °HOME CARE INSTRUCTIONS °· Do not eat or drink anything until the numbing medicine (local anesthetic) has worn off and your gag reflex has returned. You will know that the local anesthetic has worn off when you can swallow comfortably. °· Do not drive for 12 hours after the procedure or as directed by your caregiver. °· Only take medicines as directed by your caregiver. °SEEK MEDICAL CARE IF:  °· You cannot stop coughing. °· You are not urinating at all or less than usual. °SEEK IMMEDIATE MEDICAL CARE IF: °· You have difficulty swallowing. °· You cannot eat or drink. °· You have worsening throat or chest pain. °· You have dizziness, lightheadedness, or you faint. °· You have nausea or vomiting. °· You have chills. °· You have a fever. °· You have severe abdominal pain. °· You have black, tarry, or bloody stools. °Document Released: 10/14/2012 Document Reviewed: 10/14/2012 °[employer]® Patient Information ©2015 [employer]. This information is not intended to replace advice given to you by your health care provider. Make sure you discuss any questions you have with your health care provider. ° °

## 2015-02-02 NOTE — Anesthesia Postprocedure Evaluation (Signed)
  Anesthesia Post-op Note  Patient: Keith [name]  Procedure(s) Performed: Procedure(s) (LRB): ESOPHAGOGASTRODUODENOSCOPY (EGD) WITH PROPOFOL (N/A)  Patient Location: PACU  Anesthesia Type: MAC  Level of Consciousness: awake and alert   Airway and Oxygen Therapy: Patient Spontanous Breathing  Post-op Pain: mild  Post-op Assessment: Post-op Vital signs reviewed, Patient's Cardiovascular Status Stable, Respiratory Function Stable, Patent Airway and No signs of Nausea or vomiting  Last Vitals:  Filed Vitals:   02/02/15 1155  BP:   Pulse: 46  Temp:   Resp: 13    Post-op Vital Signs: stable   Complications: No apparent anesthesia complications

## 2015-02-02 NOTE — Anesthesia Preprocedure Evaluation (Signed)
Anesthesia Evaluation  Patient identified by MRN, date of birth, ID band Patient awake    Reviewed: Allergy & Precautions, NPO status , Patient's Chart, lab work & pertinent test results  History of Anesthesia Complications (+) DIFFICULT AIRWAY and history of anesthetic complications  Airway Mallampati: II  TM Distance: >3 FB Neck ROM: Limited   Comment: H/O difficult intubation. Very limited neck mobility s/p cervical fusion. Dental no notable dental hx.    Pulmonary asthma ,  breath sounds clear to auscultation  Pulmonary exam normal       Cardiovascular hypertension, Pt. on medications and Pt. on home beta blockers + dysrhythmias Rhythm:Regular Rate:Normal     Neuro/Psych  Headaches, PSYCHIATRIC DISORDERS Depression    GI/Hepatic Neg liver ROS, GERD-  ,  Endo/Other  negative endocrine ROS  Renal/GU negative Renal ROS  negative genitourinary   Musculoskeletal negative musculoskeletal ROS (+)   Abdominal   Peds negative pediatric ROS (+)  Hematology negative hematology ROS (+)   Anesthesia Other Findings   Reproductive/Obstetrics negative OB ROS                             Anesthesia Physical Anesthesia Plan  ASA: II  Anesthesia Plan: MAC   Post-op Pain Management:    Induction: Intravenous  Airway Management Planned:   Additional Equipment:   Intra-op Plan:   Post-operative Plan:   Informed Consent: I have reviewed the patients History and Physical, chart, labs and discussed the procedure including the risks, benefits and alternatives for the proposed anesthesia with the patient or authorized representative who has indicated his/her understanding and acceptance.   Dental advisory given  Plan Discussed with: CRNA  Anesthesia Plan Comments:         Anesthesia Quick Evaluation

## 2015-02-02 NOTE — Transfer of Care (Signed)
Immediate Anesthesia Transfer of Care Note  Patient: Keith [name]  Procedure(s) Performed: Procedure(s): ESOPHAGOGASTRODUODENOSCOPY (EGD) WITH PROPOFOL (N/A)  Patient Location: PACU and Endoscopy Unit  Anesthesia Type:MAC  Level of Consciousness: awake, alert , oriented, patient cooperative and responds to stimulation  Airway & Oxygen Therapy: Patient Spontanous Breathing and Patient connected to nasal cannula oxygen  Post-op Assessment: Report given to RN, Post -op Vital signs reviewed and stable and Patient moving all extremities  Post vital signs: Reviewed and stable  Last Vitals:  Filed Vitals:   02/02/15 1130  BP: 130/57  Pulse: 52  Temp:   Resp: 13    Complications: No apparent anesthesia complications

## 2015-02-02 NOTE — H&P (Signed)
Keith [name] is an 69 y.o. male.   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 He also had a recent food impaction which lasted for about 5 minutes, which is the only one he has ever had.  Past Medical History  Diagnosis Date  . Hypertension   . Dysrhythmia     hx wide comlex tach during a stress test 2002-dr smith  . Depression   . Asthma     as child  . Wears glasses   . Headache(784.0)   . GERD (gastroesophageal reflux disease)     hx   . Neck problem     has had 2 cervical surgeries with fusion-limited neck mobility  . Difficult intubation     very limited neck mobility post op fusion  . PTSD (post-traumatic stress disorder)     Past Surgical History  Procedure Laterality Date  . Cervical laminectomy  1985  . Cervical fusion  1998  . Tonsillectomy    . Carpal tunnel release      both rt and lt  . Upper gastrointestinal endoscopy    . Colonoscopy w/ biopsies    . Incision and drainage abscess Right 01/27/2013    Procedure: INCISION AND DRAINAGE ABSCESS;  Surgeon: Wynonia Sours, MD;  Location: [place];  Service: Orthopedics;  Laterality: Right;    History reviewed. No pertinent family history. Social History:  reports that he has never smoked. He does not have any smokeless tobacco history on file. He reports that he does not drink alcohol or use illicit drugs.  Allergies:  Allergies  Allergen Reactions  . Ivp Dye [Iodinated Diagnostic Agents]     SWELLING OF NOSE AND THROAT    Medications Prior to Admission  Medication Sig Dispense Refill  . amLODipine (NORVASC) 10 MG tablet Take 10 mg by mouth every morning.     Marland Kitchen aspirin EC 81 MG tablet Take 81 mg by mouth daily.    Marland Kitchen buPROPion (WELLBUTRIN XL) 150 MG 24 hr tablet Take 450 mg by mouth daily.     Marland Kitchen buPROPion (WELLBUTRIN XL) 300 MG 24 hr tablet Take 450 mg by mouth  daily.     . Cholecalciferol (VITAMIN D3) 5000 UNITS TABS Take 1 tablet by mouth daily.    . folic acid (FOLVITE) 951 MCG tablet Take 800 mcg by mouth daily.     Marland Kitchen ibuprofen (ADVIL,MOTRIN) 200 MG tablet Take 800 mg by mouth every 6 (six) hours as needed for mild pain or moderate pain.    . metoprolol succinate (TOPROL-XL) 50 MG 24 hr tablet Take 100 mg by mouth every morning. Take with or immediately following a meal.    . olmesartan-hydrochlorothiazide (BENICAR HCT) 40-12.5 MG per tablet Take 1 tablet by mouth every morning.     . rosuvastatin (CRESTOR) 5 MG tablet Take 5 mg by mouth daily.      No results found for this or any previous visit (from the past 48 hour(s)). No results found.  ROS negative for weight loss  Blood pressure 144/70, pulse 51, temperature 98.5 F (36.9 C), temperature source Oral, resp. rate 12, height 5\' 9"  (1.753 m), weight 85.73 kg (189 lb), SpO2 97 %. Physical Exam pleasant, no distress, does not appear anxious or depressed. Chest clear, heart without murmur or arrhythmia. Abdomen soft and nontender, no masses. Oropharynx benign  Assessment/Plan GERD, dysphagia.   Plan:  We will proceed to endoscopic evaluation to look for adverse mucosal sequelae of reflux. However, the patient and I had decided, as an outpatient when I saw him to set up this procedure, that this dysphagia symptoms were not sufficiently severe to necessitate esophageal dilatation at this time.   Thandiwe Siragusa,Edge V 02/02/2015, 11:13 AM

## 2015-02-02 NOTE — Op Note (Signed)
[place], [id number]   ENDOSCOPY PROCEDURE REPORT  PATIENT: Keith [name], Keith [name]  MR#: [id number] BIRTHDATE: December 04, 1945 , 69  yrs. old GENDER: male ENDOSCOPIST:Dornell [place], MD REFERRED BY:  Dr. Coral Ceo PROCEDURE DATE:  2015-02-27 PROCEDURE:   upper endoscopy with biopsies ASA CLASS:    II INDICATIONS: reflux, history of intermittent dysphagia MEDICATION: MAC TOPICAL ANESTHETIC:   none  DESCRIPTION OF PROCEDURE:   After the risks and benefits of the procedure were explained, informed consent was obtained.  the patient came as an outpatient to the [facility] long endoscopy in it.The Pentax Gastroscope [id number]  endoscope was introduced through the mouth  and advanced to the second portion of the duodenum . The instrument was slowly withdrawn as the mucosa was fully examined. Estimated blood loss is zero unless otherwise noted in this procedure report.    the vocal cords were not well seen during passage of the scope. the esophagus was entered under direct vision. The esophagus was normal down to the GE junction, where there was a widely patent but rather prominent Schatzki's ring, below which was a 3 cm hiatal hernia. Also, there were 2 very small tongues of salmon-colored mucosa, consistent with possible very short segment Barrett's esophagus (roughly 5 mm extent). Each of these tongues was biopsied. I also took a biopsy right at the level of the ring itself, with the hope that it would "release" the ring to some degree.  The abdominal portion of the stomach was entered. There was some antral erythema but no erosive changes despite antecedent aspirin exposure. No ulcers, polyps, or masses were seen. A retroflexed view of the cardia was normal. The pylorus, duodenal bulb, and second duodenum also looked normal.  rr    The scope was then withdrawn from the patient and the procedure completed.  COMPLICATIONS: There were no immediate  complications.  ENDOSCOPIC IMPRESSION: 1. 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 Prominent Schatzki's ring, fairly widely patent, presumably accounting for the patient's recent dysphagia symptoms  RECOMMENDATIONS: 1. Await pathology results. Consider surveillance endoscopy in 3-5 years if intestinal metaplasia is present. 2. Observation with respect to dysphagia symptoms. Consider esophageal dilatation if they become more problematic in the future.   _______________________________ eSignedRonald Lobo, MD 02-27-2015 11:34 AM     cc:  CPT CODES: ICD CODES:  The ICD and CPT codes recommended by this software are interpretations from the data that the clinical staff has captured with the software.  The verification of the translation of this report to the ICD and CPT codes and modifiers is the sole responsibility of the health care institution and practicing physician where this report was generated.  [place]. will not be held responsible for the validity of the ICD and CPT codes included on this report.  AMA assumes no liability for data contained or not contained herein. CPT is a Designer, television/film set of the [employer].  PATIENT NAME:  [name], [name] MR#: [id number]

## 2015-02-06 ENCOUNTER — Encounter (HOSPITAL_COMMUNITY): Payer: Self-pay | Admitting: Gastroenterology

## 2015-08-26 ENCOUNTER — Emergency Department (HOSPITAL_COMMUNITY): Payer: Medicare Other

## 2015-08-26 ENCOUNTER — Encounter (HOSPITAL_COMMUNITY): Payer: Self-pay

## 2015-08-26 ENCOUNTER — Emergency Department (HOSPITAL_COMMUNITY)
Admission: EM | Admit: 2015-08-26 | Discharge: 2015-08-27 | Disposition: A | Discharge status: Home / Self Care | Payer: Medicare Other | Attending: Emergency Medicine | Admitting: Emergency Medicine

## 2015-08-26 DIAGNOSIS — Z8659 Personal history of other mental and behavioral disorders: Secondary | ICD-10-CM | POA: Diagnosis not present

## 2015-08-26 DIAGNOSIS — R350 Frequency of micturition: Secondary | ICD-10-CM | POA: Diagnosis not present

## 2015-08-26 DIAGNOSIS — K219 Gastro-esophageal reflux disease without esophagitis: Secondary | ICD-10-CM | POA: Diagnosis not present

## 2015-08-26 DIAGNOSIS — Z79899 Other long term (current) drug therapy: Secondary | ICD-10-CM | POA: Diagnosis not present

## 2015-08-26 DIAGNOSIS — Z7982 Long term (current) use of aspirin: Secondary | ICD-10-CM | POA: Insufficient documentation

## 2015-08-26 DIAGNOSIS — F329 Major depressive disorder, single episode, unspecified: Secondary | ICD-10-CM | POA: Diagnosis not present

## 2015-08-26 DIAGNOSIS — J45909 Unspecified asthma, uncomplicated: Secondary | ICD-10-CM | POA: Diagnosis not present

## 2015-08-26 DIAGNOSIS — K5732 Diverticulitis of large intestine without perforation or abscess without bleeding: Secondary | ICD-10-CM | POA: Diagnosis not present

## 2015-08-26 DIAGNOSIS — Z87438 Personal history of other diseases of male genital organs: Secondary | ICD-10-CM | POA: Diagnosis not present

## 2015-08-26 DIAGNOSIS — I10 Essential (primary) hypertension: Secondary | ICD-10-CM | POA: Diagnosis not present

## 2015-08-26 DIAGNOSIS — R1030 Lower abdominal pain, unspecified: Secondary | ICD-10-CM | POA: Diagnosis present

## 2015-08-26 LAB — URINALYSIS, ROUTINE W REFLEX MICROSCOPIC
BILIRUBIN URINE: NEGATIVE
Glucose, UA: NEGATIVE mg/dL
Ketones, ur: NEGATIVE mg/dL
Leukocytes, UA: NEGATIVE
NITRITE: NEGATIVE
PH: 5 (ref 5.0–8.0)
Protein, ur: NEGATIVE mg/dL
SPECIFIC GRAVITY, URINE: 1.022 (ref 1.005–1.030)
Urobilinogen, UA: 1 mg/dL (ref 0.0–1.0)

## 2015-08-26 LAB — COMPREHENSIVE METABOLIC PANEL
ALK PHOS: 64 U/L (ref 38–126)
ALT: 20 U/L (ref 17–63)
AST: 30 U/L (ref 15–41)
Albumin: 4 g/dL (ref 3.5–5.0)
Anion gap: 7 (ref 5–15)
BUN: 15 mg/dL (ref 6–20)
CALCIUM: 9.5 mg/dL (ref 8.9–10.3)
CHLORIDE: 105 mmol/L (ref 101–111)
CO2: 27 mmol/L (ref 22–32)
CREATININE: 1.37 mg/dL — AB (ref 0.61–1.24)
GFR calc non Af Amer: 51 mL/min — ABNORMAL LOW (ref >60)
GFR, EST AFRICAN AMERICAN: 59 mL/min — AB (ref >60)
Glucose, Bld: 101 mg/dL — ABNORMAL HIGH (ref 65–99)
Potassium: 3.9 mmol/L (ref 3.5–5.1)
SODIUM: 139 mmol/L (ref 135–145)
Total Bilirubin: 0.9 mg/dL (ref 0.3–1.2)
Total Protein: 6.8 g/dL (ref 6.5–8.1)

## 2015-08-26 LAB — CBC
HCT: 43.1 % (ref 39.0–52.0)
Hemoglobin: 15.3 g/dL (ref 13.0–17.0)
MCH: 31.2 pg (ref 26.0–34.0)
MCHC: 35.5 g/dL (ref 30.0–36.0)
MCV: 87.8 fL (ref 78.0–100.0)
PLATELETS: 239 10*3/uL (ref 150–400)
RBC: 4.91 MIL/uL (ref 4.22–5.81)
RDW: 12.5 % (ref 11.5–15.5)
WBC: 11.4 10*3/uL — ABNORMAL HIGH (ref 4.0–10.5)

## 2015-08-26 LAB — URINE MICROSCOPIC-ADD ON

## 2015-08-26 MED ORDER — IBUPROFEN 200 MG PO TABS
600.0000 mg | ORAL_TABLET | Freq: Once | ORAL | Status: AC
  Administered 2015-08-26: 600 mg via ORAL
  Filled 2015-08-26 (×2): qty 1

## 2015-08-26 MED ORDER — BARIUM SULFATE 2.1 % PO SUSP
ORAL | Status: AC
  Filled 2015-08-26: qty 2

## 2015-08-26 NOTE — ED Provider Notes (Signed)
CSN: [id number]     Arrival date & time 08/26/15  1827 History   First MD Initiated Contact with Patient 08/26/15 1904     Chief Complaint  Patient presents with  . Abdominal Pain     (Consider location/radiation/quality/duration/timing/severity/associated sxs/prior Treatment) Patient is a 69 y.o. male presenting with abdominal pain.  Abdominal Pain Pain location:  Suprapubic Pain quality: aching   Pain radiation: bil testicles. Pain severity:  Moderate Onset quality:  Gradual Duration: last few days. Timing:  Constant Progression:  Worsening Chronicity:  New Relieved by:  Nothing Worsened by:  Movement and palpation Associated symptoms: chills   Associated symptoms: no anorexia, no dysuria, no fever, no hematuria, no nausea and no vomiting     Past Medical History  Diagnosis Date  . Hypertension   . Dysrhythmia     hx wide comlex tach during a stress test 2002-dr smith  . Depression   . Asthma     as child  . Wears glasses   . Headache(784.0)   . GERD (gastroesophageal reflux disease)     hx   . Neck problem     has had 2 cervical surgeries with fusion-limited neck mobility  . Difficult intubation     very limited neck mobility post op fusion  . PTSD (post-traumatic stress disorder)    Past Surgical History  Procedure Laterality Date  . Cervical laminectomy  1985  . Cervical fusion  1998  . Tonsillectomy    . Carpal tunnel release      both rt and lt  . Upper gastrointestinal endoscopy    . Colonoscopy w/ biopsies    . Incision and drainage abscess Right 01/27/2013    Procedure: INCISION AND DRAINAGE ABSCESS;  Surgeon: Wynonia Sours, MD;  Location: [place];  Service: Orthopedics;  Laterality: Right;  . Esophagogastroduodenoscopy (egd) with propofol N/A 02/02/2015    Procedure: ESOPHAGOGASTRODUODENOSCOPY (EGD) WITH PROPOFOL;  Surgeon: Ronald Lobo, MD;  Location: [facility];  Service: Endoscopy;  Laterality: N/A;   No family history on  file. Social History  Substance Use Topics  . Smoking status: Never Smoker   . Smokeless tobacco: None  . Alcohol Use: No    Review of Systems  Constitutional: Positive for chills. Negative for fever.  Gastrointestinal: Positive for abdominal pain. Negative for nausea, vomiting and anorexia.  Genitourinary: Negative for dysuria and hematuria.  All other systems reviewed and are negative.     Allergies  Ivp dye  Home Medications   Prior to Admission medications   Medication Sig Start Date End Date Taking? Authorizing Provider  amLODipine (NORVASC) 10 MG tablet Take 10 mg by mouth daily.    Yes Historical Provider, MD  aspirin EC 81 MG tablet Take 81 mg by mouth daily.   Yes Historical Provider, MD  buPROPion (WELLBUTRIN XL) 150 MG 24 hr tablet Take 450 mg by mouth daily.    Yes Historical Provider, MD  Cholecalciferol (VITAMIN D3) 5000 UNITS TABS Take 5,000 Units by mouth daily.    Yes Historical Provider, MD  folic acid (FOLVITE) 007 MCG tablet Take 800 mcg by mouth daily.    Yes Historical Provider, MD  ibuprofen (ADVIL,MOTRIN) 200 MG tablet Take 800 mg by mouth daily as needed for mild pain (pain).    Yes Historical Provider, MD  metoprolol succinate (TOPROL-XL) 50 MG 24 hr tablet Take 100 mg by mouth daily. Take with or immediately following a meal.   Yes Historical Provider, MD  ranitidine (  ZANTAC) 150 MG tablet Take 300 mg by mouth daily. 07/10/15  Yes Historical Provider, MD  rosuvastatin (CRESTOR) 5 MG tablet Take 5 mg by mouth every Monday, Wednesday, and Friday at 6 PM.    Yes Historical Provider, MD  vitamin C (ASCORBIC ACID) 500 MG tablet Take 500 mg by mouth daily.   Yes Historical Provider, MD  ciprofloxacin (CIPRO) 500 MG tablet Take 1 tablet (500 mg total) by mouth 2 (two) times daily. One po bid x 21 days 08/27/15   Debby Freiberg, MD  metroNIDAZOLE (FLAGYL) 500 MG tablet Take 1 tablet (500 mg total) by mouth 2 (two) times daily. One po bid x 7 days 08/27/15    Debby Freiberg, MD   BP 124/59 mmHg  Pulse 56  Temp(Src) 99 F (37.2 C) (Oral)  Resp 16  Ht 5\' 9"  (1.753 m)  Wt 193 lb (87.544 kg)  BMI 28.49 kg/m2  SpO2 97% Physical Exam  Constitutional: He is oriented to person, place, and time. He appears well-developed and well-nourished.  HENT:  Head: Normocephalic and atraumatic.  Eyes: Conjunctivae and EOM are normal.  Neck: Normal range of motion. Neck supple.  Cardiovascular: Normal rate, regular rhythm and normal heart sounds.   Pulmonary/Chest: Effort normal and breath sounds normal. No respiratory distress.  Abdominal: He exhibits no distension. There is tenderness in the right lower quadrant, suprapubic area and left lower quadrant. There is no rebound and no guarding. Hernia confirmed negative in the right inguinal area and confirmed negative in the left inguinal area.  Genitourinary: Penis normal. Right testis shows tenderness (mild). Left testis shows no tenderness. Circumcised.  Musculoskeletal: Normal range of motion.  Lymphadenopathy:       Right: No inguinal adenopathy present.       Left: No inguinal adenopathy present.  Neurological: He is alert and oriented to person, place, and time.  Skin: Skin is warm and dry.  Vitals reviewed.   ED Course  Procedures (including critical care time) Labs Review Labs Reviewed  URINALYSIS, ROUTINE W REFLEX MICROSCOPIC (NOT AT [facility]) - Abnormal; Notable for the following:    Hgb urine dipstick LARGE (*)    All other components within normal limits  COMPREHENSIVE METABOLIC PANEL - Abnormal; Notable for the following:    Glucose, Bld 101 (*)    Creatinine, Ser 1.37 (*)    GFR calc non Af Amer 51 (*)    GFR calc Af Amer 59 (*)    All other components within normal limits  CBC - Abnormal; Notable for the following:    WBC 11.4 (*)    All other components within normal limits  URINE MICROSCOPIC-ADD ON    Imaging Review Ct Abdomen Pelvis Wo Contrast  08/26/2015  CLINICAL DATA:   Bilateral lower quadrant pain and tenderness. EXAM: CT ABDOMEN AND PELVIS WITHOUT CONTRAST TECHNIQUE: Multidetector CT imaging of the abdomen and pelvis was performed following the standard protocol without IV contrast. COMPARISON:  12/09/2013 FINDINGS: Lower chest:  No acute findings. Hepatobiliary: No mass visualized on this un-enhanced exam. Gallbladder is unremarkable. Pancreas: No mass or inflammatory process identified on this un-enhanced exam. Spleen: Within normal limits in size. Adrenals/Urinary Tract: No evidence of urolithiasis or hydronephrosis. Left renal parapelvic cysts again noted. Stomach/Bowel: Normal appendix visualized. Sigmoid diverticulosis is demonstrated with mild colonic wall thickening and pericolonic inflammatory changes. This consistent with mild diverticulitis. No evidence of abscess or free fluid. Vascular/Lymphatic: No pathologically enlarged lymph nodes. No evidence of abdominal aortic aneurysm. Reproductive: All markedly  enlarged prostate remains stable with mass effect on bladder base. Other: None. Musculoskeletal:  No suspicious bone lesions identified. IMPRESSION: Mild sigmoid diverticulitis. No evidence of abscess or other complications. Stable moderately enlarged prostate. Electronically Signed   By: Earle Gell M.D.   On: 08/26/2015 23:22   I have personally reviewed and evaluated these images and lab results as part of my medical decision-making.   EKG Interpretation None      MDM   Final diagnoses:  Diverticulitis of large intestine without perforation or abscess without bleeding    69 y.o. male with pertinent PMH of HTN, Depression presents with urinary frequency, abd pain.  Patient has a known history of likely BPH with ongoing workup for potential prostate cancer. 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  No systemic symptoms with the exception of mild chills and elevated oral temperature which is not febrile. On arrival today vitals  signs and physical exam as above. The patient has a large prostate, however it is minimally to nontender.  His urinalysis demonstrated hematuria, no signs of lateralizing symptoms indicative of nephrolithiasis.  CT scan as above with diverticulitis.  DC home with cipro/flagyl  I have reviewed all laboratory and imaging studies if ordered as above  1. Diverticulitis of large intestine without perforation or abscess without bleeding         Debby Freiberg, MD 08/27/15 [phone]

## 2015-08-26 NOTE — ED Notes (Signed)
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  Pt. Also has developed a cough that is non-productive but has become constant.

## 2015-08-27 MED ORDER — CIPROFLOXACIN HCL 500 MG PO TABS
500.0000 mg | ORAL_TABLET | Freq: Once | ORAL | Status: AC
  Administered 2015-08-27: 500 mg via ORAL
  Filled 2015-08-27: qty 1

## 2015-08-27 MED ORDER — METRONIDAZOLE 500 MG PO TABS
500.0000 mg | ORAL_TABLET | Freq: Once | ORAL | Status: AC
  Administered 2015-08-27: 500 mg via ORAL
  Filled 2015-08-27: qty 1

## 2015-08-27 MED ORDER — CIPROFLOXACIN HCL 500 MG PO TABS: 500.0000 mg | ORAL_TABLET | Freq: Two times a day (BID) | ORAL | Status: DC

## 2015-08-27 MED ORDER — METRONIDAZOLE 500 MG PO TABS: 500.0000 mg | ORAL_TABLET | Freq: Two times a day (BID) | ORAL | Status: DC

## 2015-08-27 NOTE — Discharge Instructions (Signed)
Diverticulitis Diverticulitis is inflammation or infection of small pouches in your colon that form when you have a condition called diverticulosis. The pouches in your colon are called diverticula. Your colon, or large intestine, is where water is absorbed and stool is formed. Complications of diverticulitis can include:  Bleeding.  Severe infection.  Severe pain.  Perforation of your colon.  Obstruction of your colon. CAUSES  Diverticulitis is caused by bacteria. Diverticulitis happens when stool becomes trapped in diverticula. This allows bacteria to grow in the diverticula, which can lead to inflammation and infection. RISK FACTORS People with diverticulosis are at risk for diverticulitis. Eating a diet that does not include enough fiber from fruits and vegetables may make diverticulitis more likely to develop. SYMPTOMS  Symptoms of diverticulitis may include:  Abdominal pain and tenderness. The pain is normally located on the left side of the abdomen, but may occur in other areas.  Fever and chills.  Bloating.  Cramping.  Nausea.  Vomiting.  Constipation.  Diarrhea.  Blood in your stool. DIAGNOSIS  Your health care provider will ask you about your medical history and do a physical exam. You may need to have tests done because many medical conditions can cause the same symptoms as diverticulitis. Tests may include:  Blood tests.  Urine tests.  Imaging tests of the abdomen, including X-rays and CT scans. When your condition is under control, your health care provider may recommend that you have a colonoscopy. A colonoscopy can show how severe your diverticula are and whether something else is causing your symptoms. TREATMENT  Most cases of diverticulitis are mild and can be treated at home. Treatment may include:  Taking over-the-counter pain medicines.  Following a clear liquid diet.  Taking antibiotic medicines by mouth for 7-10 days. More severe cases may  be treated at a hospital. Treatment may include:  Not eating or drinking.  Taking prescription pain medicine.  Receiving antibiotic medicines through an IV tube.  Receiving fluids and nutrition through an IV tube.  Surgery. HOME CARE INSTRUCTIONS   Follow your health care provider's instructions carefully.  Follow a full liquid diet or other diet as directed by your health care provider. After your symptoms improve, your health care provider may tell you to change your diet. He or she may recommend you eat a high-fiber diet. Fruits and vegetables are good sources of fiber. Fiber makes it easier to pass stool.  Take fiber supplements or probiotics as directed by your health care provider.  Only take medicines as directed by your health care provider.  Keep all your follow-up appointments. SEEK MEDICAL CARE IF:   Your pain does not improve.  You have a hard time eating food.  Your bowel movements do not return to normal. SEEK IMMEDIATE MEDICAL CARE IF:   Your pain becomes worse.  Your symptoms do not get better.  Your symptoms suddenly get worse.  You have a fever.  You have repeated vomiting.  You have bloody or black, tarry stools. MAKE SURE YOU:   Understand these instructions.  Will watch your condition.  Will get help right away if you are not doing well or get worse.   This information is not intended to replace advice given to you by your health care provider. Make sure you discuss any questions you have with your health care provider.   Document Released: 08/07/2005 Document Revised: 11/02/2013 Document Reviewed: 09/22/2013 Elsevier Interactive Patient Education [employer]. Prostatitis The prostate gland is about the size and  shape of a walnut. It is located just below your bladder. It produces one of the components of semen, which is made up of sperm and the fluids that help nourish and transport it out from the testicles. Prostatitis is  inflammation of the prostate gland.  There are four types of prostatitis:  Acute bacterial prostatitis. This is the least common type of prostatitis. It starts quickly and usually is associated with a bladder infection, high fever, and shaking chills. It can occur at any age.  Chronic bacterial prostatitis. This is a persistent bacterial infection in the prostate. It usually develops from repeated acute bacterial prostatitis or acute bacterial prostatitis that was not properly treated. It can occur in men of any age but is most common in middle-aged men whose prostate has begun to enlarge. The symptoms are not as severe as those in acute bacterial prostatitis. Discomfort in the part of your body that is in front of your rectum and below your scrotum (perineum), lower abdomen, or in the head of your penis (glans) may represent your primary discomfort.  Chronic prostatitis (nonbacterial). This is the most common type of prostatitis. It is inflammation of the prostate gland that is not caused by a bacterial infection. The cause is unknown and may be associated with a viral infection or autoimmune disorder.  Prostatodynia (pelvic floor disorder). This is associated with increased muscular tone in the pelvis surrounding the prostate. CAUSES The causes of bacterial prostatitis are bacterial infection. The causes of the other types of prostatitis are unknown.  SYMPTOMS  Symptoms can vary depending upon the type of prostatitis that exists. There can also be overlap in symptoms. Possible symptoms for each type of prostatitis are listed below. Acute Bacterial Prostatitis  Painful urination.  Fever or chills.  Muscle or joint pains.  Low back pain.  Low abdominal pain.  Inability to empty bladder completely. Chronic Bacterial Prostatitis, Chronic Nonbacterial Prostatitis, and Prostatodynia  Sudden urge to urinate.  Frequent urination.  Difficulty starting urine stream.  Weak urine  stream.  Discharge from the urethra.  Dribbling after urination.  Rectal pain.  Pain in the testicles, penis, or tip of the penis.  Pain in the perineum.  Problems with sexual function.  Painful ejaculation.  Bloody semen. DIAGNOSIS  In order to diagnose prostatitis, your health care provider will ask about your symptoms. One or more urine samples will be taken and tested (urinalysis). If the urinalysis result is negative for bacteria, your health care provider may use a finger to feel your prostate (digital rectal exam). This exam helps your health care provider determine if your prostate is swollen and tender. It will also produce a specimen of semen that can be analyzed. TREATMENT  Treatment for prostatitis depends on the cause. If a bacterial infection is the cause, it can be treated with antibiotic medicine. In cases of chronic bacterial prostatitis, the use of antibiotics for up to 1 month or 6 weeks may be necessary. Your health care provider may instruct you to take sitz baths to help relieve pain. A sitz bath is a bath of hot water in which your hips and buttocks are under water. This relaxes the pelvic floor muscles and often helps to relieve the pressure on your prostate. HOME CARE INSTRUCTIONS   Take all medicines as directed by your health care provider.  Take sitz baths as directed by your health care provider. SEEK MEDICAL CARE IF:   Your symptoms get worse, not better.  You have a  fever. SEEK IMMEDIATE MEDICAL CARE IF:   You have chills.  You feel nauseous or vomit.  You feel lightheaded or faint.  You are unable to urinate.  You have blood or blood clots in your urine. MAKE SURE YOU:  Understand these instructions.  Will watch your condition.  Will get help right away if you are not doing well or get worse.   This information is not intended to replace advice given to you by your health care provider. Make sure you discuss any questions you have with  your health care provider.   Document Released: 10/25/2000 Document Revised: 11/18/2014 Document Reviewed: 05/17/2013 Elsevier Interactive Patient Education [employer].

## 2015-09-26 ENCOUNTER — Other Ambulatory Visit: Payer: Self-pay | Admitting: Urology

## 2015-09-26 DIAGNOSIS — Z139 Encounter for screening, unspecified: Secondary | ICD-10-CM

## 2015-10-03 ENCOUNTER — Ambulatory Visit
Admission: RE | Admit: 2015-10-03 | Discharge: 2015-10-03 | Disposition: A | Discharge status: Home / Self Care | Payer: Medicare Other | Source: Ambulatory Visit | Attending: Urology | Admitting: Urology

## 2015-10-03 DIAGNOSIS — Z139 Encounter for screening, unspecified: Secondary | ICD-10-CM

## 2015-10-16 ENCOUNTER — Other Ambulatory Visit: Payer: Self-pay | Admitting: Urology

## 2015-10-26 ENCOUNTER — Encounter (HOSPITAL_COMMUNITY)
Admission: RE | Admit: 2015-10-26 | Discharge: 2015-10-26 | Disposition: A | Discharge status: Home / Self Care | Payer: Medicare Other | Source: Ambulatory Visit | Attending: Urology | Admitting: Urology

## 2015-10-26 ENCOUNTER — Encounter (HOSPITAL_COMMUNITY): Payer: Self-pay

## 2015-10-26 DIAGNOSIS — K5720 Diverticulitis of large intestine with perforation and abscess without bleeding: Secondary | ICD-10-CM | POA: Diagnosis not present

## 2015-10-26 DIAGNOSIS — R1032 Left lower quadrant pain: Secondary | ICD-10-CM | POA: Diagnosis not present

## 2015-10-26 HISTORY — DX: Pure hypercholesterolemia, unspecified: E78.00

## 2015-10-26 HISTORY — DX: Personal history of other medical treatment: Z92.89

## 2015-10-26 HISTORY — DX: Unspecified intracranial injury with loss of consciousness status unknown, initial encounter: S06.9XAA

## 2015-10-26 HISTORY — DX: Tinnitus, unspecified ear: H93.19

## 2015-10-26 HISTORY — DX: Benign prostatic hyperplasia without lower urinary tract symptoms: N40.0

## 2015-10-26 HISTORY — DX: Elevated prostate specific antigen (PSA): R97.20

## 2015-10-26 HISTORY — DX: Nocturia: R35.1

## 2015-10-26 HISTORY — DX: Unspecified osteoarthritis, unspecified site: M19.90

## 2015-10-26 HISTORY — DX: Diverticulitis of intestine, part unspecified, without perforation or abscess without bleeding: K57.92

## 2015-10-26 HISTORY — DX: Unspecified intracranial injury with loss of consciousness of unspecified duration, initial encounter: S06.9X9A

## 2015-10-26 LAB — CBC
HCT: 43.2 % (ref 39.0–52.0)
HEMOGLOBIN: 15.1 g/dL (ref 13.0–17.0)
MCH: 30.7 pg (ref 26.0–34.0)
MCHC: 35 g/dL (ref 30.0–36.0)
MCV: 87.8 fL (ref 78.0–100.0)
Platelets: 289 10*3/uL (ref 150–400)
RBC: 4.92 MIL/uL (ref 4.22–5.81)
RDW: 12.5 % (ref 11.5–15.5)
WBC: 6.4 10*3/uL (ref 4.0–10.5)

## 2015-10-26 LAB — BASIC METABOLIC PANEL
Anion gap: 7 (ref 5–15)
BUN: 15 mg/dL (ref 6–20)
CHLORIDE: 107 mmol/L (ref 101–111)
CO2: 29 mmol/L (ref 22–32)
Calcium: 9.4 mg/dL (ref 8.9–10.3)
Creatinine, Ser: 1.06 mg/dL (ref 0.61–1.24)
GFR calc non Af Amer: >60 mL/min (ref >60)
Glucose, Bld: 97 mg/dL (ref 65–99)
POTASSIUM: 4.3 mmol/L (ref 3.5–5.1)
SODIUM: 143 mmol/L (ref 135–145)

## 2015-10-26 NOTE — Progress Notes (Signed)
Spoke with Dr Landry Dyke and Dr Veatrice Kells / anesthesia in regards to pts concern with intubation, pts H&P, EKG for 10/26/2015 and 04/15/2013. No orders given. Anesthesia to see pt day of surgery.

## 2015-10-26 NOTE — Patient Instructions (Addendum)
[name], MD  10/26/2015   Your procedure is scheduled on: Monday October 30, 2015   Report to [facility] Main  Entrance take [place]  elevators to 3rd floor to  [place] at 12:45 PM.  Call this number if you have problems the morning of surgery [phone]   Remember: ONLY 1 PERSON MAY GO WITH YOU TO SHORT STAY TO GET  READY MORNING OF [place].  Do not eat food After Midnight but may take clear liquids till 9:15 am day of surgery then nothing by mouth.      Take these medicines the morning of surgery with A SIP OF WATER: Amlodipine (Norvasc); Bupropion (Wellbutrin); Metoprolol; Ranitidine (Zantac)                               You may not have any metal on your body including hair pins and              piercings  Do not wear jewelry,  lotions, powders or colognes, deodorant                           Men may shave face and neck.   Do not bring valuables to the hospital. [place].  Contacts, dentures or bridgework may not be worn into surgery.      Patients discharged the day of surgery will not be allowed to drive home.  Name and phone number of your driver:Susan Stiefel (significant other)  Special Instructions: FOLLOW SURGEON'S INSTRUCTION IN REGARDS TO BOWEL PREPARATION DAY PRIOR TO SURGERY                                               FLEETS ENEMA NIGHT PRIOR TO SURGERY OR 2 HOURS PRIOR TO SURGERY             _____________________________________________________________________             [facility] - Preparing for Surgery Before surgery, you can play an important role.  Because skin is not sterile, your skin needs to be as free of germs as possible.  You can reduce the number of germs on your skin by washing with CHG (chlorahexidine gluconate) soap before surgery.  CHG is an antiseptic cleaner which kills germs and bonds with the skin to continue killing germs even after  washing. Please DO NOT use if you have an allergy to CHG or antibacterial soaps.  If your skin becomes reddened/irritated stop using the CHG and inform your nurse when you arrive at Short Stay. Do not shave (including legs and underarms) for at least 48 hours prior to the first CHG shower.  You may shave your face/neck. Please follow these instructions carefully:  1.  Shower with CHG Soap the night before surgery and the  morning of Surgery.  2.  If you choose to wash your hair, wash your hair first as usual with your  normal  shampoo.  3.  After you shampoo, rinse your hair and body thoroughly to remove the  shampoo.  4.  Use CHG as you would any other liquid soap.  You can apply chg directly  to the skin and wash                       Gently with a scrungie or clean washcloth.  5.  Apply the CHG Soap to your body ONLY FROM THE NECK DOWN.   Do not use on face/ open                           Wound or open sores. Avoid contact with eyes, ears mouth and genitals (private parts).                       Wash face,  Genitals (private parts) with your normal soap.             6.  Wash thoroughly, paying special attention to the area where your surgery  will be performed.  7.  Thoroughly rinse your body with warm water from the neck down.  8.  DO NOT shower/wash with your normal soap after using and rinsing off  the CHG Soap.                9.  Pat yourself dry with a clean towel.            10.  Wear clean pajamas.            11.  Place clean sheets on your bed the night of your first shower and do not  sleep with pets. Day of Surgery : Do not apply any lotions/deodorants the morning of surgery.  Please wear clean clothes to the hospital/surgery center.  FAILURE TO FOLLOW THESE INSTRUCTIONS MAY RESULT IN THE CANCELLATION OF YOUR SURGERY PATIENT SIGNATURE_________________________________  NURSE  SIGNATURE__________________________________  ________________________________________________________________________    CLEAR LIQUID DIET   Foods Allowed                                                                     Foods Excluded  Coffee and tea, regular and decaf                             liquids that you cannot  Plain Jell-O in any flavor                                             see through such as: Fruit ices (not with fruit pulp)                                     milk, soups, orange juice  Iced Popsicles                                    All solid food Carbonated beverages, regular and diet  Cranberry, grape and apple juices Sports drinks like Gatorade Lightly seasoned clear broth or consume(fat free) Sugar, honey syrup  Sample Menu Breakfast                                Lunch                                     Supper Cranberry juice                    Beef broth                            Chicken broth Jell-O                                     Grape juice                           Apple juice Coffee or tea                        Jell-O                                      Popsicle                                                Coffee or tea                        Coffee or tea  _____________________________________________________________________

## 2015-10-29 ENCOUNTER — Encounter (HOSPITAL_BASED_OUTPATIENT_CLINIC_OR_DEPARTMENT_OTHER): Payer: Self-pay | Admitting: Emergency Medicine

## 2015-10-29 ENCOUNTER — Inpatient Hospital Stay (HOSPITAL_BASED_OUTPATIENT_CLINIC_OR_DEPARTMENT_OTHER)
Admission: EM | Admit: 2015-10-29 | Discharge: 2015-11-02 | DRG: 392 | Disposition: A | Discharge status: Home / Self Care | Payer: Medicare Other | Attending: Internal Medicine | Admitting: Internal Medicine

## 2015-10-29 ENCOUNTER — Emergency Department (HOSPITAL_BASED_OUTPATIENT_CLINIC_OR_DEPARTMENT_OTHER): Payer: Medicare Other

## 2015-10-29 DIAGNOSIS — K631 Perforation of intestine (nontraumatic): Secondary | ICD-10-CM | POA: Diagnosis not present

## 2015-10-29 DIAGNOSIS — R1032 Left lower quadrant pain: Secondary | ICD-10-CM

## 2015-10-29 DIAGNOSIS — E876 Hypokalemia: Secondary | ICD-10-CM | POA: Diagnosis present

## 2015-10-29 DIAGNOSIS — Z7982 Long term (current) use of aspirin: Secondary | ICD-10-CM | POA: Diagnosis not present

## 2015-10-29 DIAGNOSIS — I471 Supraventricular tachycardia, unspecified: Secondary | ICD-10-CM | POA: Diagnosis present

## 2015-10-29 DIAGNOSIS — F329 Major depressive disorder, single episode, unspecified: Secondary | ICD-10-CM | POA: Diagnosis present

## 2015-10-29 DIAGNOSIS — K5780 Diverticulitis of intestine, part unspecified, with perforation and abscess without bleeding: Secondary | ICD-10-CM | POA: Diagnosis not present

## 2015-10-29 DIAGNOSIS — Z981 Arthrodesis status: Secondary | ICD-10-CM | POA: Diagnosis not present

## 2015-10-29 DIAGNOSIS — K219 Gastro-esophageal reflux disease without esophagitis: Secondary | ICD-10-CM | POA: Diagnosis present

## 2015-10-29 DIAGNOSIS — R109 Unspecified abdominal pain: Secondary | ICD-10-CM | POA: Insufficient documentation

## 2015-10-29 DIAGNOSIS — K5732 Diverticulitis of large intestine without perforation or abscess without bleeding: Secondary | ICD-10-CM | POA: Diagnosis present

## 2015-10-29 DIAGNOSIS — K5720 Diverticulitis of large intestine with perforation and abscess without bleeding: Secondary | ICD-10-CM | POA: Diagnosis present

## 2015-10-29 DIAGNOSIS — Z79899 Other long term (current) drug therapy: Secondary | ICD-10-CM

## 2015-10-29 DIAGNOSIS — E785 Hyperlipidemia, unspecified: Secondary | ICD-10-CM | POA: Diagnosis present

## 2015-10-29 DIAGNOSIS — I10 Essential (primary) hypertension: Secondary | ICD-10-CM | POA: Diagnosis present

## 2015-10-29 DIAGNOSIS — N400 Enlarged prostate without lower urinary tract symptoms: Secondary | ICD-10-CM | POA: Diagnosis present

## 2015-10-29 LAB — CBC
HCT: 42.5 % (ref 39.0–52.0)
HEMOGLOBIN: 14.7 g/dL (ref 13.0–17.0)
MCH: 30.1 pg (ref 26.0–34.0)
MCHC: 34.6 g/dL (ref 30.0–36.0)
MCV: 87.1 fL (ref 78.0–100.0)
PLATELETS: 294 10*3/uL (ref 150–400)
RBC: 4.88 MIL/uL (ref 4.22–5.81)
RDW: 12.7 % (ref 11.5–15.5)
WBC: 6.2 10*3/uL (ref 4.0–10.5)

## 2015-10-29 LAB — COMPREHENSIVE METABOLIC PANEL
ALBUMIN: 3.8 g/dL (ref 3.5–5.0)
ALK PHOS: 64 U/L (ref 38–126)
ALT: 16 U/L — AB (ref 17–63)
AST: 25 U/L (ref 15–41)
Anion gap: 7 (ref 5–15)
BUN: 14 mg/dL (ref 6–20)
CHLORIDE: 106 mmol/L (ref 101–111)
CO2: 26 mmol/L (ref 22–32)
CREATININE: 1.03 mg/dL (ref 0.61–1.24)
Calcium: 9.2 mg/dL (ref 8.9–10.3)
GFR calc non Af Amer: >60 mL/min (ref >60)
GLUCOSE: 109 mg/dL — AB (ref 65–99)
Potassium: 3.2 mmol/L — ABNORMAL LOW (ref 3.5–5.1)
SODIUM: 139 mmol/L (ref 135–145)
Total Bilirubin: 0.6 mg/dL (ref 0.3–1.2)
Total Protein: 6.9 g/dL (ref 6.5–8.1)

## 2015-10-29 LAB — LIPASE, BLOOD: LIPASE: 34 U/L (ref 11–51)

## 2015-10-29 LAB — URINE MICROSCOPIC-ADD ON: SQUAMOUS EPITHELIAL / LPF: NONE SEEN

## 2015-10-29 LAB — URINALYSIS, ROUTINE W REFLEX MICROSCOPIC
BILIRUBIN URINE: NEGATIVE
Glucose, UA: NEGATIVE mg/dL
Ketones, ur: NEGATIVE mg/dL
NITRITE: NEGATIVE
PH: 5.5 (ref 5.0–8.0)
Protein, ur: 30 mg/dL — AB
SPECIFIC GRAVITY, URINE: 1.024 (ref 1.005–1.030)

## 2015-10-29 MED ORDER — ONDANSETRON HCL 4 MG PO TABS
4.0000 mg | ORAL_TABLET | Freq: Four times a day (QID) | ORAL | Status: DC | PRN
  Filled 2015-10-29: qty 1

## 2015-10-29 MED ORDER — ACETAMINOPHEN 650 MG RE SUPP: 650.0000 mg | Freq: Four times a day (QID) | RECTAL | Status: DC | PRN

## 2015-10-29 MED ORDER — HYDROMORPHONE HCL 1 MG/ML IJ SOLN: 0.5000 mg | INTRAMUSCULAR | Status: DC | PRN

## 2015-10-29 MED ORDER — DEXTROSE 5 % IV SOLN
2.0000 g | Freq: Two times a day (BID) | INTRAVENOUS | Status: DC
  Administered 2015-10-29 (×2): 2 g via INTRAVENOUS
  Filled 2015-10-29 (×4): qty 2

## 2015-10-29 MED ORDER — HYDRALAZINE HCL 20 MG/ML IJ SOLN: 5.0000 mg | Freq: Four times a day (QID) | INTRAMUSCULAR | Status: DC | PRN

## 2015-10-29 MED ORDER — ONDANSETRON HCL 4 MG/2ML IJ SOLN: 4.0000 mg | Freq: Four times a day (QID) | INTRAMUSCULAR | Status: DC | PRN

## 2015-10-29 MED ORDER — PANTOPRAZOLE SODIUM 40 MG IV SOLR
40.0000 mg | INTRAVENOUS | Status: DC
  Administered 2015-10-29 – 2015-10-31 (×3): 40 mg via INTRAVENOUS
  Filled 2015-10-29 (×3): qty 40

## 2015-10-29 MED ORDER — METOPROLOL TARTRATE 1 MG/ML IV SOLN
2.5000 mg | Freq: Four times a day (QID) | INTRAVENOUS | Status: DC
  Administered 2015-10-29: 2.5 mg via INTRAVENOUS
  Filled 2015-10-29: qty 5

## 2015-10-29 MED ORDER — POTASSIUM CHLORIDE IN NACL 20-0.9 MEQ/L-% IV SOLN
INTRAVENOUS | Status: DC
  Administered 2015-10-29: 21:00:00 via INTRAVENOUS
  Administered 2015-10-30: 1000 mL via INTRAVENOUS
  Filled 2015-10-29 (×4): qty 1000

## 2015-10-29 MED ORDER — ACETAMINOPHEN 325 MG PO TABS
650.0000 mg | ORAL_TABLET | Freq: Four times a day (QID) | ORAL | Status: DC | PRN
  Administered 2015-10-30: 650 mg via ORAL
  Filled 2015-10-29: qty 2

## 2015-10-29 MED ORDER — ONDANSETRON HCL 4 MG/2ML IJ SOLN: 4.0000 mg | Freq: Three times a day (TID) | INTRAMUSCULAR | Status: DC | PRN

## 2015-10-29 MED ORDER — HYDROMORPHONE HCL 1 MG/ML IJ SOLN: 1.0000 mg | INTRAMUSCULAR | Status: DC | PRN

## 2015-10-29 MED ORDER — CEFOTAXIME SODIUM 1 G IJ SOLR
INTRAMUSCULAR | Status: AC
  Filled 2015-10-29: qty 2
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
## 2015-10-29 NOTE — ED Notes (Signed)
Keith [name] [phone] primary contact

## 2015-10-29 NOTE — ED Notes (Signed)
Pt reports acute onset of suprapubic pain with chills, body ache, recently treated for diverticulitis
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
## 2015-10-29 NOTE — ED Notes (Signed)
Pt is scheduled for prostate biopsy tomorrow, developed suprapubic abdominal pain today, pt spoke with urologist who recommended he come to er for evaluation to r/o diverticulitis. If pt has diverticulitis prostate biopsy will need to be resceduled

## 2015-10-29 NOTE — Progress Notes (Signed)
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 In oct he had abd pain and went to ER and had diverticulitis and was put on cipro and flagyl Had prostate issues f/u with GU and was supposed to have biopsy results). But GU said have CT to r/o diverticulitis prior to biopsy Tender on abd exam Dr. Lucia Gaskins aware of pt transfer  Keep NPO, IV abx for few days, will consult by tomorrow am.   Med surg bed assigned  Leisa Lenz [facility] [id number]

## 2015-10-30 ENCOUNTER — Ambulatory Visit (HOSPITAL_COMMUNITY): Admission: RE | Admit: 2015-10-30 | Payer: Medicare Other | Source: Ambulatory Visit | Admitting: Urology

## 2015-10-30 ENCOUNTER — Encounter (HOSPITAL_COMMUNITY): Admission: RE | Payer: Self-pay | Source: Ambulatory Visit

## 2015-10-30 ENCOUNTER — Encounter (HOSPITAL_COMMUNITY): Payer: Self-pay | Admitting: General Surgery

## 2015-10-30 DIAGNOSIS — K5780 Diverticulitis of intestine, part unspecified, with perforation and abscess without bleeding: Secondary | ICD-10-CM

## 2015-10-30 DIAGNOSIS — R1032 Left lower quadrant pain: Secondary | ICD-10-CM

## 2015-10-30 DIAGNOSIS — I10 Essential (primary) hypertension: Secondary | ICD-10-CM

## 2015-10-30 DIAGNOSIS — E876 Hypokalemia: Secondary | ICD-10-CM

## 2015-10-30 DIAGNOSIS — K631 Perforation of intestine (nontraumatic): Secondary | ICD-10-CM

## 2015-10-30 LAB — CBC
HCT: 40.9 % (ref 39.0–52.0)
HEMOGLOBIN: 14.2 g/dL (ref 13.0–17.0)
MCH: 30.5 pg (ref 26.0–34.0)
MCHC: 34.7 g/dL (ref 30.0–36.0)
MCV: 88 fL (ref 78.0–100.0)
PLATELETS: 266 10*3/uL (ref 150–400)
RBC: 4.65 MIL/uL (ref 4.22–5.81)
RDW: 12.7 % (ref 11.5–15.5)
WBC: 6.7 10*3/uL (ref 4.0–10.5)

## 2015-10-30 LAB — BASIC METABOLIC PANEL
ANION GAP: 8 (ref 5–15)
BUN: 12 mg/dL (ref 6–20)
CALCIUM: 9.3 mg/dL (ref 8.9–10.3)
CO2: 27 mmol/L (ref 22–32)
Chloride: 108 mmol/L (ref 101–111)
Creatinine, Ser: 1.18 mg/dL (ref 0.61–1.24)
GLUCOSE: 97 mg/dL (ref 65–99)
Potassium: 3.6 mmol/L (ref 3.5–5.1)
Sodium: 143 mmol/L (ref 135–145)

## 2015-10-30 SURGERY — BIOPSY, PROSTATE, RECTAL APPROACH, WITH US GUIDANCE
Anesthesia: Monitor Anesthesia Care

## 2015-10-30 MED ORDER — BUPROPION HCL ER (XL) 300 MG PO TB24
450.0000 mg | ORAL_TABLET | Freq: Every day | ORAL | Status: DC
  Administered 2015-10-30 – 2015-11-02 (×4): 450 mg via ORAL
  Filled 2015-10-30 (×4): qty 1

## 2015-10-30 MED ORDER — METOPROLOL SUCCINATE ER 50 MG PO TB24
100.0000 mg | ORAL_TABLET | Freq: Every day | ORAL | Status: DC
  Administered 2015-10-30 – 2015-11-02 (×4): 100 mg via ORAL
  Filled 2015-10-30 (×4): qty 2

## 2015-10-30 MED ORDER — PIPERACILLIN-TAZOBACTAM 3.375 G IVPB
3.3750 g | Freq: Three times a day (TID) | INTRAVENOUS | Status: DC
  Administered 2015-10-30 – 2015-11-01 (×6): 3.375 g via INTRAVENOUS
  Filled 2015-10-30 (×6): qty 50

## 2015-10-30 NOTE — Progress Notes (Signed)
Report from [place], [place]. Care assumed for pt at this time. Pt resting in bed, no c/o at this time. Assessment as charted. Will monitor.

## 2015-10-30 NOTE — Progress Notes (Signed)
Initial Nutrition Assessment  INTERVENTION:   Diet advancement per MD Encourage PO intake RD to continue to monitor  NUTRITION DIAGNOSIS:   Inadequate oral intake related to inability to eat as evidenced by other (see comment) (clear liquid diet for bowel rest).  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  Pt reports poor appetite since October but noticeable since a couple of days ago where he didn't eat much at all. Pt has had around 15 lb weight loss since October, UBW is 195 lb. Pt reports he will not be having surgery. He is now on clear liquids. Will monitor PO intake for supplement needs.  Nutrition focused physical exam shows no sign of depletion of muscle mass or body fat.  Labs reviewed.  Diet Order:  Diet clear liquid Room service appropriate?: Yes; Fluid consistency:: Thin  Skin:  Reviewed, no issues  Last BM:  12/18  Height:   Ht Readings from Last 1 Encounters:  10/29/15 5' 9.5" (1.765 m)    Weight:   Wt Readings from Last 1 Encounters:  10/29/15 182 lb 12.2 oz (82.9 kg)    Ideal Body Weight:  75.5 kg  BMI:  Body mass index is 26.61 kg/(m^2).  Estimated Nutritional Needs:   Kcal:  1900-2100  Protein:  95-105g  Fluid:  2L/day  EDUCATION NEEDS:   No education needs identified at this time  Clayton Bibles, MS, RD, LDN Pager: [phone] After Hours Pager: [phone]
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
## 2015-10-30 NOTE — Progress Notes (Signed)
PROGRESS NOTE  Keith Beckmann, MD SE:[id number] DOB: 1946-06-25 DOA: 10/29/2015 PCP: Simona Huh, MD  Assessment/Plan: Perforated bowel (HCC)/Diverticulitis of colon/LLQ pain IV zosyn clears surgery consult  Hypokalemia replaced   Essential hypertension resume home meds   Paroxysmal SVT (supraventricular tachycardia) ([place]) -resume home meds  GERD (gastroesophageal reflux disease) Protonix while inpt   Code Status: full Family Communication: patient Disposition Plan:    Consultants:  surgery  Procedures:      HPI/Subjective: Feeling better  Objective: Filed Vitals:   10/29/15 1856 10/30/15 0507  BP: 134/70 130/60  Pulse: 56 51  Temp: 97.7 F (36.5 C) 98.3 F (36.8 C)  Resp: 18 20    Intake/Output Summary (Last 24 hours) at 10/30/15 1212 Last data filed at 10/29/15 1708  Gross per 24 hour  Intake      0 ml  Output    300 ml  Net   -300 ml   Filed Weights   10/29/15 1211 10/29/15 1940  Weight: 81.647 kg (180 lb) 82.9 kg (182 lb 12.2 oz)    Exam:   General:  Awake, NAD  Cardiovascular: rrr  Respiratory: clear  Abdomen: +BS, tender in LLQ only  Musculoskeletal: no edema   Data Reviewed: Basic Metabolic Panel:  Recent Labs Lab 10/26/15 1015 10/29/15 1230 10/30/15 0410  NA 143 139 143  K 4.3 3.2* 3.6  CL 107 106 108  CO2 29 26 27   GLUCOSE 97 109* 97  BUN 15 14 12   CREATININE 1.06 1.03 1.18  CALCIUM 9.4 9.2 9.3   Liver Function Tests:  Recent Labs Lab 10/29/15 1230  AST 25  ALT 16*  ALKPHOS 64  BILITOT 0.6  PROT 6.9  ALBUMIN 3.8    Recent Labs Lab 10/29/15 1230  LIPASE 34   No results for input(s): AMMONIA in the last 168 hours. CBC:  Recent Labs Lab 10/26/15 1015 10/29/15 1230 10/30/15 0410  WBC 6.4 6.2 6.7  HGB 15.1 14.7 14.2  HCT 43.2 42.5 40.9  MCV  87.8 87.1 88.0  PLT 289 294 266   Cardiac Enzymes: No results for input(s): CKTOTAL, CKMB, CKMBINDEX, TROPONINI in the last 168 hours. BNP (last 3 results) No results for input(s): BNP in the last 8760 hours.  ProBNP (last 3 results) No results for input(s): PROBNP in the last 8760 hours.  CBG: No results for input(s): GLUCAP in the last 168 hours.  No results found for this or any previous visit (from the past 240 hour(s)).   Studies: Ct Abdomen Pelvis Wo Contrast  10/29/2015  CLINICAL DATA:  Left lower quadrant pain for several days, nausea, decreased appetite. Recent diverticulitis in October of 2016 treated with antibiotics. Patient also describes suprapubic pain. EXAM: CT ABDOMEN AND PELVIS WITHOUT CONTRAST TECHNIQUE: Multidetector CT imaging of the abdomen and pelvis was performed following the standard protocol without IV contrast. COMPARISON:  None. FINDINGS: There is localized thickening of the walls of the mid sigmoid colon, within the central pelvis. There is a collection of air along the posterior margin of this segment of the sigmoid colon, compatible with contained perforation localized to the posterior wall, measuring 3.5 x 2.9 x 3.7 cm (AP by transverse by craniocaudal dimensions). This is in the same location as the acute diverticulitis identified on CT of 08/26/2015, likely sequela of the earlier diverticulitis. No free fluid or abscess-like fluid collection identified in the abdomen or pelvis. No dilated large or small bowel loops.  Appendix is normal. Liver, spleen, pancreas, gallbladder, and adrenal  glands are normal. Benign parapelvic renal cysts again noted bilaterally. No renal stone or hydronephrosis. Prostate gland is prominently enlarged causing mass effect on the bladder base, similar to the previous study. Lung bases are clear. Abdominal aorta is normal in caliber. Scattered degenerative changes are seen throughout the thoracolumbar spine but no acute osseous  abnormality. Superficial soft tissues are unremarkable. IMPRESSION: 1. Focal thickening of the walls of the mid sigmoid colon, within the central pelvis, at the same location as the acute diverticulitis identified on CT of 08/26/2015. Walled-off collection of air is now seen along the posterior margin of this segment of the sigmoid colon, measuring 3.5 x 2.9 x 3.7 cm, compatible with contained perforation and likely sequela of the earlier diverticulitis. No associated fluid collection or abscess-like collection. The mild paracolic inflammation in this area is likely residual from the earlier diverticulitis, or associated developing scarring/fibrosis. 2. No free intraperitoneal air. No bowel obstruction. No evidence of new/acute bowel wall inflammation. 3. Prostate gland enlargement causing mass effect on the bladder base. Electronically Signed   By: [name] M.D.   On: 10/29/2015 14:30    Scheduled Meds: . metoprolol  2.5 mg Intravenous 4 times per day  . pantoprazole (PROTONIX) IV  40 mg Intravenous Q24H  . piperacillin-tazobactam (ZOSYN)  IV  3.375 g Intravenous Q8H   Continuous Infusions: . 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 10/30/2015, 12:12 PM  LOS: 1 day
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
## 2015-10-31 DIAGNOSIS — I471 Supraventricular tachycardia: Secondary | ICD-10-CM

## 2015-10-31 DIAGNOSIS — K5732 Diverticulitis of large intestine without perforation or abscess without bleeding: Secondary | ICD-10-CM

## 2015-10-31 LAB — URINE CULTURE

## 2015-10-31 NOTE — Progress Notes (Signed)
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 He reports having Chills, but no Fevers and also reports having malaise and a loss of appetite. He reports having loose Stools but denies diarrhea. He was evaluated in the ED and found to have Diverticulitis/Diverticular Abscess with perforation.He reports having a previous bout of Diverticulitis in 08/2015 and was treated outpatient with Oral Cipro and Flagyl.   Assessment/Plan: Perforated bowel (HCC)/Diverticulitis of colon/LLQ pain IV zosyn- plan to d/c on at least 2 weeks of augmentin clears for 1 more day surgery consult  Hypokalemia replaced   Essential hypertension resume home meds   Paroxysmal SVT (supraventricular tachycardia) (HCC) -resume home meds  GERD (gastroesophageal reflux disease) Protonix while inpt   Code Status: full Family Communication: patient Disposition Plan:    Consultants:  surgery  Procedures:      HPI/Subjective: Tolerating clears-- still with 1 spot of tenderness  Objective: Filed Vitals:   10/31/15 0208 10/31/15 0554  BP: 120/51 121/60  Pulse: 50 50  Temp: 97.7 F (36.5 C) 97.6 F (36.4 C)  Resp: 20 20    Intake/Output Summary (Last 24 hours) at 10/31/15 0858 Last data filed at 10/31/15 0600  Gross per 24 hour  Intake 3279.17 ml  Output      0 ml  Net 3279.17 ml   Filed Weights   10/29/15 1211 10/29/15 1940  Weight: 81.647 kg (180 lb) 82.9 kg (182 lb 12.2 oz)    Exam:   General:  Awake, NAD  Cardiovascular: rrr  Respiratory: clear  Abdomen:  +BS, tender in LLQ only  Musculoskeletal: no edema   Data Reviewed: Basic Metabolic Panel:  Recent Labs Lab 10/26/15 1015 10/29/15 1230 10/30/15 0410  NA 143 139 143  K 4.3 3.2* 3.6  CL 107 106 108  CO2 29 26 27   GLUCOSE 97 109* 97  BUN 15 14 12   CREATININE 1.06 1.03 1.18  CALCIUM 9.4 9.2 9.3   Liver Function Tests:  Recent Labs Lab 10/29/15 1230  AST 25  ALT 16*  ALKPHOS 64  BILITOT 0.6  PROT 6.9  ALBUMIN 3.8    Recent Labs Lab 10/29/15 1230  LIPASE 34   No results for input(s): AMMONIA in the last 168 hours. CBC:  Recent Labs Lab 10/26/15 1015 10/29/15 1230 10/30/15 0410  WBC 6.4 6.2 6.7  HGB 15.1 14.7 14.2  HCT 43.2 42.5 40.9  MCV 87.8 87.1 88.0  PLT 289 294 266   Cardiac Enzymes: No results for input(s): CKTOTAL, CKMB, CKMBINDEX, TROPONINI in the last 168 hours. BNP (last 3 results) No results for input(s): BNP in the last 8760 hours.  ProBNP (last 3 results) No results for input(s): PROBNP in the last 8760 hours.  CBG: No results for input(s): GLUCAP in the last 168 hours.  No results found for this or any previous visit (from the past 240 hour(s)).   Studies: Ct Abdomen Pelvis Wo Contrast  10/29/2015  CLINICAL DATA:  Left lower quadrant pain for several days, nausea, decreased appetite. Recent diverticulitis in October of 2016 treated with antibiotics. Patient also describes suprapubic pain. EXAM: CT ABDOMEN AND PELVIS WITHOUT CONTRAST TECHNIQUE: Multidetector CT imaging of the abdomen and pelvis was performed following the  standard protocol without IV contrast. COMPARISON:  None. FINDINGS: There is localized thickening of the walls of the mid sigmoid colon, within the central pelvis. There is a collection of air along the posterior margin of this segment of the sigmoid colon, compatible with contained perforation localized to the posterior wall, measuring 3.5 x 2.9 x 3.7 cm (AP by transverse by craniocaudal dimensions). This is in the same  location as the acute diverticulitis identified on CT of 08/26/2015, likely sequela of the earlier diverticulitis. No free fluid or abscess-like fluid collection identified in the abdomen or pelvis. No dilated large or small bowel loops.  Appendix is normal. Liver, spleen, pancreas, gallbladder, and adrenal glands are normal. Benign parapelvic renal cysts again noted bilaterally. No renal stone or hydronephrosis. Prostate gland is prominently enlarged causing mass effect on the bladder base, similar to the previous study. Lung bases are clear. Abdominal aorta is normal in caliber. Scattered degenerative changes are seen throughout the thoracolumbar spine but no acute osseous abnormality. Superficial soft tissues are unremarkable. IMPRESSION: 1. Focal thickening of the walls of the mid sigmoid colon, within the central pelvis, at the same location as the acute diverticulitis identified on CT of 08/26/2015. Walled-off collection of air is now seen along the posterior margin of this segment of the sigmoid colon, measuring 3.5 x 2.9 x 3.7 cm, compatible with contained perforation and likely sequela of the earlier diverticulitis. No associated fluid collection or abscess-like collection. The mild paracolic inflammation in this area is likely residual from the earlier diverticulitis, or associated developing scarring/fibrosis. 2. No free intraperitoneal air. No bowel obstruction. No evidence of new/acute bowel wall inflammation. 3. Prostate gland enlargement causing mass effect on the bladder base. Electronically Signed   By: [name] M.D.   On: 10/29/2015 14:30    Scheduled Meds: . buPROPion  450 mg Oral Daily  . metoprolol succinate  100 mg Oral Daily  . pantoprazole (PROTONIX) IV  40 mg Intravenous Q24H  . piperacillin-tazobactam (ZOSYN)  IV  3.375 g Intravenous Q8H   Continuous Infusions:   Antibiotics Given (last 72 hours)    Date/Time Action Medication Dose Rate   10/29/15 1504 Given   cefoTEtan  (CEFOTAN) 2 g in dextrose 5 % 50 mL IVPB 2 g 100 mL/hr   10/29/15 2315 Given   cefoTEtan (CEFOTAN) 2 g in dextrose 5 % 50 mL IVPB 2 g 100 mL/hr   10/30/15 1232 Given   piperacillin-tazobactam (ZOSYN) IVPB 3.375 g 3.375 g 12.5 mL/hr   10/30/15 1951 Given   piperacillin-tazobactam (ZOSYN) IVPB 3.375 g 3.375 g 12.5 mL/hr   10/31/15 0400 Given  [computer rebooting and not coming back up yet]   piperacillin-tazobactam (ZOSYN) IVPB 3.375 g 3.375 g 12.5 mL/hr      Principal Problem:   Perforated bowel (HCC) Active Problems:   Diverticulitis of colon   LLQ pain   Hypokalemia   Essential hypertension   Paroxysmal SVT (supraventricular tachycardia) (HCC)   GERD (gastroesophageal reflux disease)   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

## 2015-10-31 NOTE — Progress Notes (Signed)
Patient ID: Keith Beckmann, MD, male   DOB: June 11, 1946, 69 y.o.   MRN: [id number]    Subjective: Pt says he feels quite a bit better today.  Still tender focally, but improved.  Objective: Vital signs in last 24 hours: Temp:  [97.6 F (36.4 C)-98.3 F (36.8 C)] 97.6 F (36.4 C) (12/20 0554) Pulse Rate:  [50-55] 50 (12/20 0554) Resp:  [20] 20 (12/20 0554) BP: (119-122)/(51-62) 121/60 mmHg (12/20 0554) SpO2:  [97 %-100 %] 100 % (12/20 0554) Last BM Date: 10/29/15  Intake/Output from previous day: 12/19 0701 - 12/20 0700 In: 3279.2 [P.O.:1080; I.V.:2049.2; IV Piggyback:150] Out: -  Intake/Output this shift:    PE: Abd: soft, still focally tender in LLQ with some guarding, but seems a little better.  +BS, ND Heart: regular  Lab Results:   Recent Labs  10/29/15 1230 10/30/15 0410  WBC 6.2 6.7  HGB 14.7 14.2  HCT 42.5 40.9  PLT 294 266   BMET  Recent Labs  10/29/15 1230 10/30/15 0410  NA 139 143  K 3.2* 3.6  CL 106 108  CO2 26 27  GLUCOSE 109* 97  BUN 14 12  CREATININE 1.03 1.18  CALCIUM 9.2 9.3   PT/INR No results for input(s): LABPROT, INR in the last 72 hours. CMP     Component Value Date/Time   NA 143 10/30/2015 0410   K 3.6 10/30/2015 0410   CL 108 10/30/2015 0410   CO2 27 10/30/2015 0410   GLUCOSE 97 10/30/2015 0410   BUN 12 10/30/2015 0410   CREATININE 1.18 10/30/2015 0410   CALCIUM 9.3 10/30/2015 0410   PROT 6.9 10/29/2015 1230   ALBUMIN 3.8 10/29/2015 1230   AST 25 10/29/2015 1230   ALT 16* 10/29/2015 1230   ALKPHOS 64 10/29/2015 1230   BILITOT 0.6 10/29/2015 1230   GFRNONAA >60 10/30/2015 0410   GFRAA >60 10/30/2015 0410   Lipase     Component Value Date/Time   LIPASE 34 10/29/2015 1230       Studies/Results: Ct Abdomen Pelvis Wo Contrast  10/29/2015  CLINICAL DATA:  Left lower quadrant pain for several days, nausea, decreased appetite. Recent diverticulitis in October of 2016 treated with antibiotics. Patient also describes  suprapubic pain. EXAM: CT ABDOMEN AND PELVIS WITHOUT CONTRAST TECHNIQUE: Multidetector CT imaging of the abdomen and pelvis was performed following the standard protocol without IV contrast. COMPARISON:  None. FINDINGS: There is localized thickening of the walls of the mid sigmoid colon, within the central pelvis. There is a collection of air along the posterior margin of this segment of the sigmoid colon, compatible with contained perforation localized to the posterior wall, measuring 3.5 x 2.9 x 3.7 cm (AP by transverse by craniocaudal dimensions). This is in the same location as the acute diverticulitis identified on CT of 08/26/2015, likely sequela of the earlier diverticulitis. No free fluid or abscess-like fluid collection identified in the abdomen or pelvis. No dilated large or small bowel loops.  Appendix is normal. Liver, spleen, pancreas, gallbladder, and adrenal glands are normal. Benign parapelvic renal cysts again noted bilaterally. No renal stone or hydronephrosis. Prostate gland is prominently enlarged causing mass effect on the bladder base, similar to the previous study. Lung bases are clear. Abdominal aorta is normal in caliber. Scattered degenerative changes are seen throughout the thoracolumbar spine but no acute osseous abnormality. Superficial soft tissues are unremarkable. IMPRESSION: 1. Focal thickening of the walls of the mid sigmoid colon, within the central pelvis, at the same  location as the acute diverticulitis identified on CT of 08/26/2015. Walled-off collection of air is now seen along the posterior margin of this segment of the sigmoid colon, measuring 3.5 x 2.9 x 3.7 cm, compatible with contained perforation and likely sequela of the earlier diverticulitis. No associated fluid collection or abscess-like collection. The mild paracolic inflammation in this area is likely residual from the earlier diverticulitis, or associated developing scarring/fibrosis. 2. No free intraperitoneal  air. No bowel obstruction. No evidence of new/acute bowel wall inflammation. 3. Prostate gland enlargement causing mass effect on the bladder base. Electronically Signed   By: [name] M.D.   On: 10/29/2015 14:30    Anti-infectives: Anti-infectives    Start     Dose/Rate Route Frequency Ordered Stop   10/30/15 1200  piperacillin-tazobactam (ZOSYN) IVPB 3.375 g     3.375 g 12.5 mL/hr over 240 Minutes Intravenous Every 8 hours 10/30/15 1054     10/29/15 1457  cefoTAXime (CLAFORAN) 1 G injection    Comments:  [place], [place]  : cabinet override      10/29/15 1457 10/29/15 1507   10/29/15 1445  cefoTEtan (CEFOTAN) 2 g in dextrose 5 % 50 mL IVPB  Status:  Discontinued     2 g 100 mL/hr over 30 Minutes Intravenous Every 12 hours 10/29/15 1435 10/30/15 1029       Assessment/Plan  1. Diverticulitis with microperforation and pocket of air (? Intra-mural) -due to tenderness in LLQ, would like to keep him on clear liquids for one more day, although he feels much better today. -cont IV Zosyn D2.   LOS: 2 days    Jenesa Foresta E 10/31/2015, 8:41 AM Pager: [phone]

## 2015-10-31 NOTE — Care Management Note (Signed)
Case Management Note  Patient Details  Name: CLIM PAWLOSKI, MD MRN: [id number] Date of Birth: 09/19/1946  Subjective/Objective:      69 yo admitted with Perforated bowel              Action/Plan: From home alone  Expected Discharge Date:                  Expected Discharge Plan:  Home/Self Care  In-House Referral:     Discharge planning Services  CM Consult  Post Acute Care Choice:    Choice offered to:     DME Arranged:    DME Agency:     HH Arranged:    [place] Agency:     Status of Service:  In process, will continue to follow  Medicare Important Message Given:    Date Medicare IM Given:    Medicare IM give by:    Date Additional Medicare IM Given:    Additional Medicare Important Message give by:     If discussed at [place] of Stay Meetings, dates discussed:    Additional Comments:  Lynnell Catalan, RN 10/31/2015, 3:35 PM

## 2015-11-01 LAB — CBC
HCT: 40.2 % (ref 39.0–52.0)
Hemoglobin: 13.8 g/dL (ref 13.0–17.0)
MCH: 30.5 pg (ref 26.0–34.0)
MCHC: 34.3 g/dL (ref 30.0–36.0)
MCV: 88.7 fL (ref 78.0–100.0)
PLATELETS: 278 10*3/uL (ref 150–400)
RBC: 4.53 MIL/uL (ref 4.22–5.81)
RDW: 12.5 % (ref 11.5–15.5)
WBC: 5.5 10*3/uL (ref 4.0–10.5)

## 2015-11-01 LAB — BASIC METABOLIC PANEL
Anion gap: 7 (ref 5–15)
BUN: 7 mg/dL (ref 6–20)
CALCIUM: 9.2 mg/dL (ref 8.9–10.3)
CO2: 29 mmol/L (ref 22–32)
CREATININE: 1.1 mg/dL (ref 0.61–1.24)
Chloride: 105 mmol/L (ref 101–111)
Glucose, Bld: 114 mg/dL — ABNORMAL HIGH (ref 65–99)
Potassium: 3.7 mmol/L (ref 3.5–5.1)
SODIUM: 141 mmol/L (ref 135–145)

## 2015-11-01 MED ORDER — PANTOPRAZOLE SODIUM 40 MG PO TBEC
40.0000 mg | DELAYED_RELEASE_TABLET | Freq: Every day | ORAL | Status: DC
  Administered 2015-11-01 – 2015-11-02 (×2): 40 mg via ORAL
  Filled 2015-11-01 (×2): qty 1

## 2015-11-01 MED ORDER — AMOXICILLIN-POT CLAVULANATE 875-125 MG PO TABS
1.0000 | ORAL_TABLET | Freq: Two times a day (BID) | ORAL | Status: DC
  Administered 2015-11-01 – 2015-11-02 (×3): 1 via ORAL
  Filled 2015-11-01 (×3): qty 1

## 2015-11-01 NOTE — Discharge Instructions (Signed)
Diverticulitis Diverticulitis is inflammation or infection of small pouches in your colon that form when you have a condition called diverticulosis. The pouches in your colon are called diverticula. Your colon, or large intestine, is where water is absorbed and stool is formed. Complications of diverticulitis can include:  Bleeding.  Severe infection.  Severe pain.  Perforation of your colon.  Obstruction of your colon. CAUSES  Diverticulitis is caused by bacteria. Diverticulitis happens when stool becomes trapped in diverticula. This allows bacteria to grow in the diverticula, which can lead to inflammation and infection. RISK FACTORS People with diverticulosis are at risk for diverticulitis. Eating a diet that does not include enough fiber from fruits and vegetables may make diverticulitis more likely to develop. SYMPTOMS  Symptoms of diverticulitis may include:  Abdominal pain and tenderness. The pain is normally located on the left side of the abdomen, but may occur in other areas.  Fever and chills.  Bloating.  Cramping.  Nausea.  Vomiting.  Constipation.  Diarrhea.  Blood in your stool. DIAGNOSIS  Your health care provider will ask you about your medical history and do a physical exam. You may need to have tests done because many medical conditions can cause the same symptoms as diverticulitis. Tests may include:  Blood tests.  Urine tests.  Imaging tests of the abdomen, including X-rays and CT scans. When your condition is under control, your health care provider may recommend that you have a colonoscopy. A colonoscopy can show how severe your diverticula are and whether something else is causing your symptoms. TREATMENT  Most cases of diverticulitis are mild and can be treated at home. Treatment may include:  Taking over-the-counter pain medicines.  Following a clear liquid diet.  Taking antibiotic medicines by mouth for 7-10 days. More severe cases may  be treated at a hospital. Treatment may include:  Not eating or drinking.  Taking prescription pain medicine.  Receiving antibiotic medicines through an IV tube.  Receiving fluids and nutrition through an IV tube.  Surgery. HOME CARE INSTRUCTIONS   Follow your health care provider's instructions carefully.  Follow a full liquid diet or other diet as directed by your health care provider. After your symptoms improve, your health care provider may tell you to change your diet. He or she may recommend you eat a high-fiber diet. Fruits and vegetables are good sources of fiber. Fiber makes it easier to pass stool.  Take fiber supplements or probiotics as directed by your health care provider.  Only take medicines as directed by your health care provider.  Keep all your follow-up appointments. SEEK MEDICAL CARE IF:   Your pain does not improve.  You have a hard time eating food.  Your bowel movements do not return to normal. SEEK IMMEDIATE MEDICAL CARE IF:   Your pain becomes worse.  Your symptoms do not get better.  Your symptoms suddenly get worse.  You have a fever.  You have repeated vomiting.  You have bloody or black, tarry stools. MAKE SURE YOU:   Understand these instructions.  Will watch your condition.  Will get help right away if you are not doing well or get worse.   This information is not intended to replace advice given to you by your health care provider. Make sure you discuss any questions you have with your health care provider.   Document Released: 08/07/2005 Document Revised: 11/02/2013 Document Reviewed: 09/22/2013 Elsevier Interactive Patient Education 2016 [employer].   Low-Fiber Diet for 3-4 weeks, then switch  to high fiber Fiber is found in fruits, vegetables, and whole grains. A low-fiber diet restricts fibrous foods that are not digested in the small intestine. A diet containing about 10-15 grams of fiber per day is considered low  fiber. Low-fiber diets may be used to:  Promote healing and rest the bowel during intestinal flare-ups.  Prevent blockage of a partially obstructed or narrowed gastrointestinal tract.  Reduce fecal weight and volume.  Slow the movement of feces. You may be on a low-fiber diet as a transitional diet following surgery, after an injury (trauma), or because of a short (acute) or lifelong (chronic) illness. Your health care provider will determine the length of time you need to stay on this diet.  WHAT DO I NEED TO KNOW ABOUT A LOW-FIBER DIET? Always check the fiber content on the packaging's Nutrition Facts label, especially on foods from the grains list. Ask your dietitian if you have questions about specific foods that are related to your condition, especially if the food is not listed below. In general, a low-fiber food will have less than 2 g of fiber. WHAT FOODS CAN I EAT? Grains All breads and crackers made with white flour. Sweet rolls, doughnuts, waffles, pancakes, [place] toast, bagels. Pretzels, Melba toast, zwieback. Well-cooked cereals, such as cornmeal, farina, or cream cereals. Dry cereals that do not contain whole grains, fruit, or nuts, such as refined corn, wheat, rice, and oat cereals. Potatoes prepared any way without skins, plain pastas and noodles, refined white rice. Use white flour for baking and making sauces. Use allowed list of grains for casseroles, dumplings, and puddings.  Vegetables Strained tomato and vegetable juices. Fresh lettuce, cucumber, spinach. Well-cooked (no skin or pulp) or canned vegetables, such as asparagus, bean sprouts, beets, carrots, green beans, mushrooms, potatoes, pumpkin, spinach, yellow squash, tomato sauce/puree, turnips, yams, and zucchini. Keep servings limited to  cup.  Fruits All fruit juices except prune juice. Cooked or canned fruits without skin and seeds, such as applesauce, apricots, cherries, fruit cocktail, grapefruit, grapes, mandarin  oranges, melons, peaches, pears, pineapple, and plums. Fresh fruits without skin, such as apricots, avocados, bananas, melons, pineapple, nectarines, and peaches. Keep servings limited to  cup or 1 piece.  Meat and Other Protein Sources Ground or well-cooked tender beef, ham, veal, lamb, pork, or poultry. Eggs, plain cheese. Fish, oysters, shrimp, lobster, and other seafood. Liver, organ meats. Smooth nut butters. Dairy All milk products and alternative dairy substitutes, such as soy, rice, almond, and coconut, not containing added whole nuts, seeds, or added fruit. Beverages Decaf coffee, fruit, and vegetable juices or smoothies (small amounts, with no pulp or skins, and with fruits from allowed list), sports drinks, herbal tea. Condiments Ketchup, mustard, vinegar, cream sauce, cheese sauce, cocoa powder. Spices in moderation, such as allspice, basil, bay leaves, celery powder or leaves, cinnamon, cumin powder, curry powder, ginger, mace, marjoram, onion or garlic powder, oregano, paprika, parsley flakes, ground pepper, rosemary, sage, savory, tarragon, thyme, and turmeric. Sweets and Desserts Plain cakes and cookies, pie made with allowed fruit, pudding, custard, cream pie. Gelatin, fruit, ice, sherbet, frozen ice pops. Ice cream, ice milk without nuts. Plain hard candy, honey, jelly, molasses, syrup, sugar, chocolate syrup, gumdrops, marshmallows. Limit overall sugar intake.  Fats and Oil Margarine, butter, cream, mayonnaise, salad oils, plain salad dressings made from allowed foods. Choose healthy fats such as olive oil, canola oil, and omega-3 fatty acids (such as found in salmon or tuna) when possible.  Other Bouillon, broth, or cream  soups made from allowed foods. Any strained soup. Casseroles or mixed dishes made with allowed foods. The items listed above may not be a complete list of recommended foods or beverages. Contact your dietitian for more options.  WHAT FOODS ARE NOT  RECOMMENDED? Grains All whole wheat and whole grain breads and crackers. Multigrains, rye, bran seeds, nuts, or coconut. Cereals containing whole grains, multigrains, bran, coconut, nuts, raisins. Cooked or dry oatmeal, steel-cut oats. Coarse wheat cereals, granola. Cereals advertised as high fiber. Potato skins. Whole grain pasta, wild or brown rice. Popcorn. Coconut flour. Bran, buckwheat, corn bread, multigrains, rye, wheat germ.  Vegetables Fresh, cooked or canned vegetables, such as artichokes, asparagus, beet greens, broccoli, Brussels sprouts, cabbage, celery, cauliflower, corn, eggplant, kale, legumes or beans, okra, peas, and tomatoes. Avoid large servings of any vegetables, especially raw vegetables.  Fruits Fresh fruits, such as apples with or without skin, berries, cherries, figs, grapes, grapefruit, guavas, kiwis, mangoes, oranges, papayas, pears, persimmons, pineapple, and pomegranate. Prune juice and juices with pulp, stewed or dried prunes. Dried fruits, dates, raisins. Fruit seeds or skins. Avoid large servings of all fresh fruits. Meats and Other Protein Sources Tough, fibrous meats with gristle. Chunky nut butter. Cheese made with seeds, nuts, or other foods not recommended. Nuts, seeds, legumes (beans, including baked beans), dried peas, beans, lentils.  Dairy Yogurt or cheese that contains nuts, seeds, or added fruit.  Beverages Fruit juices with high pulp, prune juice. Caffeinated coffee and teas.  Condiments Coconut, maple syrup, pickles, olives. Sweets and Desserts Desserts, cookies, or candies that contain nuts or coconut, chunky peanut butter, dried fruits. Jams, preserves with seeds, marmalade. Large amounts of sugar and sweets. Any other dessert made with fruits from the not recommended list.  Other Soups made from vegetables that are not recommended or that contain other foods not recommended.  The items listed above may not be a complete list of foods and beverages to  avoid. Contact your dietitian for more information.   This information is not intended to replace advice given to you by your health care provider. Make sure you discuss any questions you have with your health care provider.   Document Released: 04/19/2002 Document Revised: 11/02/2013 Document Reviewed: 09/20/2013 Elsevier Interactive Patient Education 2016 Elsevier Inc.  High-Fiber Diet Fiber, also called dietary fiber, is a type of carbohydrate found in fruits, vegetables, whole grains, and beans. A high-fiber diet can have many health benefits. Your health care provider may recommend a high-fiber diet to help:  Prevent constipation. Fiber can make your bowel movements more regular.  Lower your cholesterol.  Relieve hemorrhoids, uncomplicated diverticulosis, or irritable bowel syndrome.  Prevent overeating as part of a weight-loss plan.  Prevent heart disease, type 2 diabetes, and certain cancers. WHAT IS MY PLAN? The recommended daily intake of fiber includes:  38 grams for men under age 45.  57 grams for men over age 51.  74 grams for women under age 30.  72 grams for women over age 48. You can get the recommended daily intake of dietary fiber by eating a variety of fruits, vegetables, grains, and beans. Your health care provider may also recommend a fiber supplement if it is not possible to get enough fiber through your diet. WHAT DO I NEED TO KNOW ABOUT A HIGH-FIBER DIET?  Fiber supplements have not been widely studied for their effectiveness, so it is better to get fiber through food sources.  Always check the fiber content on thenutrition facts label of any  prepackaged food. Look for foods that contain at least 5 grams of fiber per serving.  Ask your dietitian if you have questions about specific foods that are related to your condition, especially if those foods are not listed in the following section.  Increase your daily fiber consumption gradually. Increasing your  intake of dietary fiber too quickly may cause bloating, cramping, or gas.  Drink plenty of water. Water helps you to digest fiber. WHAT FOODS CAN I EAT? Grains Whole-grain breads. Multigrain cereal. Oats and oatmeal. Brown rice. Barley. Bulgur wheat. [place]. Bran muffins. Popcorn. Rye wafer crackers. Vegetables Sweet potatoes. Spinach. Kale. Artichokes. Cabbage. Broccoli. Green peas. Carrots. Squash. Fruits Berries. Pears. Apples. Oranges. Avocados. Prunes and raisins. Dried figs. Meats and Other Protein Sources [place], kidney, pinto, and soy beans. Split peas. Lentils. Nuts and seeds. Dairy Fiber-fortified yogurt. Beverages Fiber-fortified soy milk. Fiber-fortified orange juice. Other Fiber bars. The items listed above may not be a complete list of recommended foods or beverages. Contact your dietitian for more options. WHAT FOODS ARE NOT RECOMMENDED? Grains White bread. Pasta made with refined flour. White rice. Vegetables Fried potatoes. Canned vegetables. Well-cooked vegetables.  Fruits Fruit juice. Cooked, strained fruit. Meats and Other Protein Sources Fatty cuts of meat. Fried Sales executive or fried fish. Dairy Milk. Yogurt. Cream cheese. Sour cream. Beverages Soft drinks. Other Cakes and pastries. Butter and oils. The items listed above may not be a complete list of foods and beverages to avoid. Contact your dietitian for more information. WHAT ARE SOME TIPS FOR INCLUDING HIGH-FIBER FOODS IN MY DIET?  Eat a wide variety of high-fiber foods.  Make sure that half of all grains consumed each day are whole grains.  Replace breads and cereals made from refined flour or white flour with whole-grain breads and cereals.  Replace white rice with brown rice, bulgur wheat, or millet.  Start the day with a breakfast that is high in fiber, such as a cereal that contains at least 5 grams of fiber per serving.  Use beans in place of meat in soups, salads, or pasta.  Eat high-fiber  snacks, such as berries, raw vegetables, nuts, or popcorn.   This information is not intended to replace advice given to you by your health care provider. Make sure you discuss any questions you have with your health care provider.   Document Released: 10/28/2005 Document Revised: 11/18/2014 Document Reviewed: 04/12/2014 Elsevier Interactive Patient Education [employer].

## 2015-11-01 NOTE — Care Management Important Message (Signed)
Important Message  Patient Details  Name: Keith NELLI, Keith [name] MRN: [id number] Date of Birth: 1945-12-04   Medicare Important Message Given:  Yes    Camillo Flaming 11/01/2015, 10:59 AMImportant Message  Patient Details  Name: Keith SLEIGHT, Keith [name] MRN: [id number] Date of Birth: 1946-06-23   Medicare Important Message Given:  Yes    Camillo Flaming 11/01/2015, 10:59 AM

## 2015-11-01 NOTE — Progress Notes (Signed)
Patient requested that IV be removed from his Right AC.  Catheter intact.  Patient tolerated the removal without difficulty.  Patient requests that he not have another IV placed as he plans on being discharged tomorrow.  Patient tolerated soft foods well tonight for supper.  Will continue to monitor.

## 2015-11-01 NOTE — Progress Notes (Signed)
Patient ID: Keith Beckmann, MD, male   DOB: Jul 10, 1946, 69 y.o.   MRN: [id number]    Subjective: Pt doing well today.  Pain is improved.  Still tolerating clear liquids well.  Objective: Vital signs in last 24 hours: Temp:  [97.3 F (36.3 C)-98 F (36.7 C)] 97.3 F (36.3 C) (12/21 0506) Pulse Rate:  [51-83] 83 (12/21 0506) Resp:  [16-20] 20 (12/21 0506) BP: (116-128)/(59-70) 128/67 mmHg (12/21 0506) SpO2:  [98 %-100 %] 98 % (12/21 0506) Last BM Date: 10/29/15  Intake/Output from previous day: 12/20 0701 - 12/21 0700 In: 1720 [P.O.:1720] Out: -  Intake/Output this shift:    PE: Abd: soft, much less tender today, +BS, ND Heart: regular  Lab Results:   Recent Labs  10/30/15 0410 11/01/15 0340  WBC 6.7 5.5  HGB 14.2 13.8  HCT 40.9 40.2  PLT 266 278   BMET  Recent Labs  10/30/15 0410 11/01/15 0340  NA 143 141  K 3.6 3.7  CL 108 105  CO2 27 29  GLUCOSE 97 114*  BUN 12 7  CREATININE 1.18 1.10  CALCIUM 9.3 9.2   PT/INR No results for input(s): LABPROT, INR in the last 72 hours. CMP     Component Value Date/Time   NA 141 11/01/2015 0340   K 3.7 11/01/2015 0340   CL 105 11/01/2015 0340   CO2 29 11/01/2015 0340   GLUCOSE 114* 11/01/2015 0340   BUN 7 11/01/2015 0340   CREATININE 1.10 11/01/2015 0340   CALCIUM 9.2 11/01/2015 0340   PROT 6.9 10/29/2015 1230   ALBUMIN 3.8 10/29/2015 1230   AST 25 10/29/2015 1230   ALT 16* 10/29/2015 1230   ALKPHOS 64 10/29/2015 1230   BILITOT 0.6 10/29/2015 1230   GFRNONAA >60 11/01/2015 0340   GFRAA >60 11/01/2015 0340   Lipase     Component Value Date/Time   LIPASE 34 10/29/2015 1230       Studies/Results: No results found.  Anti-infectives: Anti-infectives    Start     Dose/Rate Route Frequency Ordered Stop   11/01/15 1000  amoxicillin-clavulanate (AUGMENTIN) 875-125 MG per tablet 1 tablet     1 tablet Oral Every 12 hours 11/01/15 0810     10/30/15 1200  piperacillin-tazobactam (ZOSYN) IVPB 3.375 g   Status:  Discontinued     3.375 g 12.5 mL/hr over 240 Minutes Intravenous Every 8 hours 10/30/15 1054 11/01/15 0810   10/29/15 1457  cefoTAXime (CLAFORAN) 1 G injection    Comments:  Hodgin, [place]ianna  : cabinet override      10/29/15 1457 10/29/15 1507   10/29/15 1445  cefoTEtan (CEFOTAN) 2 g in dextrose 5 % 50 mL IVPB  Status:  Discontinued     2 g 100 mL/hr over 30 Minutes Intravenous Every 12 hours 10/29/15 1435 10/30/15 1029       Assessment/Plan  1. Diverticulitis with microperforation and collection of air (?intra-mural) -SLIV -Dc zosyn, change to oral augmentin -advance to full liquids for lunch and soft for dinner.  If he tolerates this, we will plan for DC home tomorrow -14 days of augmentin with 1 refill at home and outpatient CT scan in 1-2 weeks with follow up with Dr. Marlou Starks in 2 weeks as well.   LOS: 3 days    Traveion Ruddock E 11/01/2015, 8:10 AM Pager: [phone]

## 2015-11-02 MED ORDER — AMOXICILLIN-POT CLAVULANATE 875-125 MG PO TABS: 1.0000 | ORAL_TABLET | Freq: Two times a day (BID) | ORAL | Status: DC

## 2015-11-02 NOTE — Discharge Summary (Signed)
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 He reports having Chills, but no Fevers and also reports having malaise and a loss of appetite. He reports having loose Stools but denies diarrhea. He was evaluated in the ED and found to have Diverticulitis/Diverticular Abscess with perforation. Genral Surgery Dr. Lucia Gaskins was consulted, and patient was transferred to [facility] for admission.   Of Note: He reports having a previous bout of Diverticulitis in 08/2015 and was treated outpatient with Oral Cipro and Flagyl. He also was due to have a Prostate Biopsy by Dr Raynelle Bring of Urology on 10/30/2015 of which he Has called to postpone  Admission Diagnoses:  1. Diverticulitis with microperforation 2. HTN  Hospital Course: The patient was admitted and initially kept NPO and placed on Cefotetan by the medical service.  General surgery was consulted.  Due to CT scan findings and possible persistent infection from 2 months ago, he was transitioned to Zosyn.  He improved fairly quickly.  He was placed on clear liquids on HD 2 and 3.  He was focally tender in the LLQ that was much improved on HD 4 and his diet was able to be advanced as tolerated.  He was stable for dc home on HD 5 with 2 additional weeks of Augmentin.  He will have a follow up CT scan completed as an outpatient.  PE: Abd: soft, minimally tender, +BS, ND  Discharge Diagnoses:  Active Problems:   Diverticulitis of colon with mircorperforation   LLQ pain   Hypokalemia   Essential hypertension   Paroxysmal SVT (supraventricular tachycardia) (HCC)   GERD  (gastroesophageal reflux disease)     Discharge Medications:   Medication List    STOP taking these medications        levofloxacin 750 MG tablet  Commonly known as:  LEVAQUIN      TAKE these medications        acetaminophen 500 MG tablet  Commonly known as:  TYLENOL  Take 1,000 mg by mouth every 6 (six) hours as needed for mild pain or moderate pain.     amLODipine 10 MG tablet  Commonly known as:  NORVASC  Take 10 mg by mouth daily.     amoxicillin-clavulanate 875-125 MG tablet  Commonly known as:  AUGMENTIN  Take 1 tablet by mouth every 12 (twelve) hours.     aspirin EC 81 MG tablet  Take 81 mg by mouth daily.     buPROPion 150 MG 24 hr tablet  Commonly known as:  WELLBUTRIN XL  Take 450 mg by mouth daily.     folic acid Q000111Q MCG tablet  Commonly known as:  FOLVITE  Take 800 mcg by mouth daily.     ibuprofen 200 MG tablet  Commonly known as:  ADVIL,MOTRIN  Take 800 mg by mouth daily as needed for headache (pain).     metoprolol succinate 50 MG 24 hr tablet  Commonly known as:  TOPROL-XL  Take 100 mg by mouth daily. Take with or immediately following a meal.     ranitidine 150 MG tablet  Commonly known as:  ZANTAC  Take  300 mg by mouth daily.     rosuvastatin 10 MG tablet  Commonly known as:  CRESTOR  Take 10 mg by mouth every Monday, Wednesday, and Friday.     vitamin C 500 MG tablet  Commonly known as:  ASCORBIC ACID  Take 500 mg by mouth daily.     Vitamin D3 5000 UNITS Tabs  Take 5,000 Units by mouth daily.        Discharge Instructions: Follow-up Information    Follow up with Merrie Roof, MD On 11/15/2015.   Specialty:  General Surgery   Why:  1:40pm, arrive by 1:10pm for paperwork   Contact information:   [address] [place] [address] [phone]       Follow up with Dutch Gray, MD.   Specialty:  Urology   Why:  call to reschedule your prostate biopsy   Contact information:   [place]  [address] [phone]       Follow up with Simona Huh, MD.   Specialty:  Family Medicine   Why:  As needed   Contact information:   [place] [place] [address] [phone]       Signed: Henreitta Cea 11/02/2015, 8:10 AM

## 2015-11-10 ENCOUNTER — Other Ambulatory Visit: Payer: Self-pay | Admitting: General Surgery

## 2015-11-10 DIAGNOSIS — K5732 Diverticulitis of large intestine without perforation or abscess without bleeding: Secondary | ICD-10-CM

## 2015-11-14 ENCOUNTER — Ambulatory Visit
Admission: RE | Admit: 2015-11-14 | Discharge: 2015-11-14 | Disposition: A | Discharge status: Home / Self Care | Payer: Medicare Other | Source: Ambulatory Visit | Attending: General Surgery | Admitting: General Surgery

## 2015-11-14 ENCOUNTER — Other Ambulatory Visit: Payer: Self-pay | Admitting: General Surgery

## 2015-11-14 DIAGNOSIS — K5732 Diverticulitis of large intestine without perforation or abscess without bleeding: Secondary | ICD-10-CM

## 2015-11-21 ENCOUNTER — Other Ambulatory Visit: Payer: Self-pay | Admitting: Urology

## 2015-11-30 NOTE — Patient Instructions (Addendum)
Keith Beckmann, MD  11/30/2015   Your procedure is scheduled on: 12/07/15    Report to [facility]  Entrance take [facility]  elevators to 3rd floor to  [place] at     12noon   Call this number if you have problems the morning of surgery [phone]   Remember: ONLY 1 PERSON MAY GO WITH YOU TO SHORT STAY TO GET  READY MORNING OF YOUR SURGERY.  Do not eat food after midnite.  May have clear liquids from 12 midnite until 0730am morning of surgery then nothing by mouth.               Fleets enema nite before surgery or at least 2 hours prior to surgery      Take these medicines the morning of surgery with A SIP OF WATER:  Amlodipine, Wellbutrin, Metoprolol, Zantac                                  You may not have any metal on your body including hair pins and              piercings  Do not wear jewelry,  lotions, powders or perfumes, deodorant                          Men may shave face and neck.   Do not bring valuables to the hospital. [place].  Contacts, dentures or bridgework may not be worn into surgery.       Patients discharged the day of surgery will not be allowed to drive home.  Name and phone number of your driver:  Special Instructions: coughing and deep breathing exercises, leg exercises              Please read over the following fact sheets you were given: _____________________________________________________________________             [facility] - Preparing for Surgery Before surgery, you can play an important role.  Because skin is not sterile, your skin needs to be as free of germs as possible.  You can reduce the number of germs on your skin by washing with CHG (chlorahexidine gluconate) soap before surgery.  CHG is an antiseptic cleaner which kills germs and bonds with the skin to continue killing germs even after washing. Please DO NOT use if you have an allergy to CHG or  antibacterial soaps.  If your skin becomes reddened/irritated stop using the CHG and inform your nurse when you arrive at Short Stay. Do not shave (including legs and underarms) for at least 48 hours prior to the first CHG shower.  You may shave your face/neck. Please follow these instructions carefully:  1.  Shower with CHG Soap the night before surgery and the  morning of Surgery.  2.  If you choose to wash your hair, wash your hair first as usual with your  normal  shampoo.  3.  After you shampoo, rinse your hair and body thoroughly to remove the  shampoo.                           4.  Use CHG as you would any other liquid soap.  You can apply chg directly  to the skin and wash                       Gently with a scrungie or clean washcloth.  5.  Apply the CHG Soap to your body ONLY FROM THE NECK DOWN.   Do not use on face/ open                           Wound or open sores. Avoid contact with eyes, ears mouth and genitals (private parts).                       Wash face,  Genitals (private parts) with your normal soap.             6.  Wash thoroughly, paying special attention to the area where your surgery  will be performed.  7.  Thoroughly rinse your body with warm water from the neck down.  8.  DO NOT shower/wash with your normal soap after using and rinsing off  the CHG Soap.                9.  Pat yourself dry with a clean towel.            10.  Wear clean pajamas.            11.  Place clean sheets on your bed the night of your first shower and do not  sleep with pets. Day of Surgery : Do not apply any lotions/deodorants the morning of surgery.  Please wear clean clothes to the hospital/surgery center.  FAILURE TO FOLLOW THESE INSTRUCTIONS MAY RESULT IN THE CANCELLATION OF YOUR SURGERY PATIENT SIGNATURE_________________________________  NURSE SIGNATURE__________________________________  ________________________________________________________________________    CLEAR LIQUID  DIET   Foods Allowed                                                                     Foods Excluded  Coffee and tea, regular and decaf                             liquids that you cannot  Plain Jell-O in any flavor                                             see through such as: Fruit ices (not with fruit pulp)                                     milk, soups, orange juice  Iced Popsicles                                    All solid food Carbonated beverages, regular and diet  Cranberry, grape and apple juices Sports drinks like Gatorade Lightly seasoned clear broth or consume(fat free) Sugar, honey syrup  Sample Menu Breakfast                                Lunch                                     Supper Cranberry juice                    Beef broth                            Chicken broth Jell-O                                     Grape juice                           Apple juice Coffee or tea                        Jell-O                                      Popsicle                                                Coffee or tea                        Coffee or tea  _____________________________________________________________________

## 2015-12-04 ENCOUNTER — Encounter (HOSPITAL_COMMUNITY)
Admission: RE | Admit: 2015-12-04 | Discharge: 2015-12-04 | Disposition: A | Discharge status: Home / Self Care | Payer: Medicare Other | Source: Ambulatory Visit | Attending: Urology | Admitting: Urology

## 2015-12-04 ENCOUNTER — Encounter (HOSPITAL_COMMUNITY): Payer: Self-pay

## 2015-12-04 DIAGNOSIS — K219 Gastro-esophageal reflux disease without esophagitis: Secondary | ICD-10-CM | POA: Diagnosis not present

## 2015-12-04 DIAGNOSIS — N401 Enlarged prostate with lower urinary tract symptoms: Secondary | ICD-10-CM | POA: Diagnosis not present

## 2015-12-04 DIAGNOSIS — F329 Major depressive disorder, single episode, unspecified: Secondary | ICD-10-CM | POA: Diagnosis not present

## 2015-12-04 DIAGNOSIS — Z7982 Long term (current) use of aspirin: Secondary | ICD-10-CM | POA: Diagnosis not present

## 2015-12-04 DIAGNOSIS — R351 Nocturia: Secondary | ICD-10-CM | POA: Diagnosis not present

## 2015-12-04 DIAGNOSIS — R972 Elevated prostate specific antigen [PSA]: Secondary | ICD-10-CM | POA: Diagnosis present

## 2015-12-04 DIAGNOSIS — R3911 Hesitancy of micturition: Secondary | ICD-10-CM | POA: Diagnosis not present

## 2015-12-04 DIAGNOSIS — I10 Essential (primary) hypertension: Secondary | ICD-10-CM | POA: Diagnosis not present

## 2015-12-04 DIAGNOSIS — E7800 Pure hypercholesterolemia, unspecified: Secondary | ICD-10-CM | POA: Diagnosis not present

## 2015-12-04 DIAGNOSIS — J45909 Unspecified asthma, uncomplicated: Secondary | ICD-10-CM | POA: Diagnosis not present

## 2015-12-04 DIAGNOSIS — R3912 Poor urinary stream: Secondary | ICD-10-CM | POA: Diagnosis not present

## 2015-12-04 DIAGNOSIS — Z79899 Other long term (current) drug therapy: Secondary | ICD-10-CM | POA: Diagnosis not present

## 2015-12-04 LAB — BASIC METABOLIC PANEL
ANION GAP: 9 (ref 5–15)
BUN: 15 mg/dL (ref 6–20)
CALCIUM: 9.4 mg/dL (ref 8.9–10.3)
CHLORIDE: 105 mmol/L (ref 101–111)
CO2: 28 mmol/L (ref 22–32)
CREATININE: 1.17 mg/dL (ref 0.61–1.24)
GFR calc non Af Amer: >60 mL/min (ref >60)
Glucose, Bld: 104 mg/dL — ABNORMAL HIGH (ref 65–99)
Potassium: 4.5 mmol/L (ref 3.5–5.1)
SODIUM: 142 mmol/L (ref 135–145)

## 2015-12-04 LAB — CBC
HCT: 43 % (ref 39.0–52.0)
HEMOGLOBIN: 15.1 g/dL (ref 13.0–17.0)
MCH: 30.4 pg (ref 26.0–34.0)
MCHC: 35.1 g/dL (ref 30.0–36.0)
MCV: 86.5 fL (ref 78.0–100.0)
PLATELETS: 284 10*3/uL (ref 150–400)
RBC: 4.97 MIL/uL (ref 4.22–5.81)
RDW: 12.5 % (ref 11.5–15.5)
WBC: 6.7 10*3/uL (ref 4.0–10.5)

## 2015-12-04 NOTE — Progress Notes (Addendum)
EKG- 10/26/15- EP[facility]  Rhythm strip from 04/2013 on chart  Stress Test 2014 on chart

## 2015-12-06 NOTE — Progress Notes (Signed)
Patient called and made aware of time change.  Will have to arrange a new ride due to time change but patient stated he would handle.  Patient to complete fleets enema tonite prior to bedtime per patient.

## 2015-12-06 NOTE — H&P (Signed)
  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 He did have a history of gross hematuria at age 22 which was not evaluated but has never seen gross hematuria since then. He denies tobacco use. He did have a paternal grandfather with bladder cancer.     2) BPH/LUTS: His baseline symptoms include nocturia, hesitancy, intermittency, and a weak stream. He previously has taken alpha blocker therapy which improved his symptoms but caused severe headache and so he stopped therapy.  Current treatment: None  Prior treatment: Tamsulosin (severe headache)    3) Elevated PSA: His PSA increased to 4.24 in September 2014 but subsequently decreased back to his baseline with has ranged around 3.5. It has been stable over the past few years and has been felt to be reflective of his BPH.    4) Right epididymal pain: He presented with this in January 2015. It appeared to begin after a fall in late 2014. His pain was localized to the head of the right epididymis. Conservative therapy was recommended.     Past Medical History Problems  1. H/O septic arthritis (Z87.39) 2. History of cardiac arrhythmia (Z86.79) 3. History of depression (Z86.59) 4. History of esophageal reflux (Z87.19) 5. History of hypercholesterolemia (Z86.39) 6. History of hypertension (Z86.79) 7. History of nonmelanoma skin cancer [id number])  Surgical History Problems  1. History of Laminectomy Cervical 2. History of Tonsillectomy With Adenoidectomy  Current Meds 1. AmLODIPine Besylate 10 MG Oral Tablet;  Therapy: (R3093670) to Recorded 2. Aspirin 81 MG Oral Tablet;  Therapy: (R3093670) to Recorded 3. Folic Acid TABS;  Therapy: (R3093670) to Recorded 4. Metoprolol Tartrate 100 MG Oral Tablet;  Therapy: (R3093670) to Recorded 5. Ranitidine HCl - 300 MG Oral  Capsule;  Therapy: (R3093670) to Recorded 6. Vitamin D TABS;  Therapy: (R3093670) to Recorded 7. Wellbutrin TABS;  Therapy: (R3093670) to Recorded  Allergies Medication  1. Flomax CAPS  Family History Problems  1. Family history of Colon cancer : Father 2. Family history of malignant neoplasm of urinary bladder (Z80.52) : Paternal Grandfather 3. Family history of Hematuria : Paternal Grandfather  Social History Problems  1. Denied: History of Alcohol use 2. Death in the family, father   age 36 colon cancer 75. Death in the family, mother   age 69 ALS 33. Divorced 5. Four children 6. Never a smoker 7. Occupation   Physician  Vitals   Physical Exam Constitutional: Well nourished and well developed . No acute distress.  Rectal: Prostate size is estimated to be 85 g. Normal rectal tone, no rectal masses, prostate is smooth, symmetric and non-tender. The prostate has no nodularity.     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 I discussed the potential benefits and risks of the procedure, side effects of the proposed treatment, the likelihood of the patient achieving the goals of the procedure, and any potential problems that might occur during the procedure or recuperation. He gives informed consent to proceed.

## 2015-12-07 ENCOUNTER — Ambulatory Visit (HOSPITAL_COMMUNITY): Payer: Medicare Other | Admitting: Anesthesiology

## 2015-12-07 ENCOUNTER — Encounter (HOSPITAL_COMMUNITY): Payer: Self-pay | Admitting: Anesthesiology

## 2015-12-07 ENCOUNTER — Ambulatory Visit (HOSPITAL_COMMUNITY)
Admission: RE | Admit: 2015-12-07 | Discharge: 2015-12-07 | Disposition: A | Discharge status: Home / Self Care | Payer: Medicare Other | Source: Ambulatory Visit | Attending: Urology | Admitting: Urology

## 2015-12-07 ENCOUNTER — Encounter (HOSPITAL_COMMUNITY)
Admission: RE | Disposition: A | Discharge status: Home / Self Care | Payer: Self-pay | Source: Ambulatory Visit | Attending: Urology

## 2015-12-07 DIAGNOSIS — N401 Enlarged prostate with lower urinary tract symptoms: Secondary | ICD-10-CM | POA: Insufficient documentation

## 2015-12-07 DIAGNOSIS — R3912 Poor urinary stream: Secondary | ICD-10-CM | POA: Diagnosis not present

## 2015-12-07 DIAGNOSIS — R3911 Hesitancy of micturition: Secondary | ICD-10-CM | POA: Insufficient documentation

## 2015-12-07 DIAGNOSIS — Z79899 Other long term (current) drug therapy: Secondary | ICD-10-CM | POA: Insufficient documentation

## 2015-12-07 DIAGNOSIS — R351 Nocturia: Secondary | ICD-10-CM | POA: Insufficient documentation

## 2015-12-07 DIAGNOSIS — J45909 Unspecified asthma, uncomplicated: Secondary | ICD-10-CM | POA: Insufficient documentation

## 2015-12-07 DIAGNOSIS — F329 Major depressive disorder, single episode, unspecified: Secondary | ICD-10-CM | POA: Insufficient documentation

## 2015-12-07 DIAGNOSIS — E7800 Pure hypercholesterolemia, unspecified: Secondary | ICD-10-CM | POA: Insufficient documentation

## 2015-12-07 DIAGNOSIS — I10 Essential (primary) hypertension: Secondary | ICD-10-CM | POA: Insufficient documentation

## 2015-12-07 DIAGNOSIS — K219 Gastro-esophageal reflux disease without esophagitis: Secondary | ICD-10-CM | POA: Insufficient documentation

## 2015-12-07 DIAGNOSIS — Z7982 Long term (current) use of aspirin: Secondary | ICD-10-CM | POA: Insufficient documentation

## 2015-12-07 HISTORY — PX: PROSTATE BIOPSY: SHX241

## 2015-12-07 SURGERY — BIOPSY, PROSTATE, RECTAL APPROACH, WITH US GUIDANCE
Anesthesia: Monitor Anesthesia Care

## 2015-12-07 MED ORDER — SODIUM CHLORIDE 0.9 % IJ SOLN
INTRAMUSCULAR | Status: AC
  Filled 2015-12-07: qty 10

## 2015-12-07 MED ORDER — EPHEDRINE SULFATE 50 MG/ML IJ SOLN
INTRAMUSCULAR | Status: DC | PRN
  Administered 2015-12-07: 10 mg via INTRAVENOUS
  Administered 2015-12-07 (×2): 5 mg via INTRAVENOUS

## 2015-12-07 MED ORDER — MIDAZOLAM HCL 2 MG/2ML IJ SOLN
INTRAMUSCULAR | Status: AC
  Filled 2015-12-07: qty 2

## 2015-12-07 MED ORDER — PROMETHAZINE HCL 25 MG/ML IJ SOLN: 6.2500 mg | INTRAMUSCULAR | Status: DC | PRN

## 2015-12-07 MED ORDER — FENTANYL CITRATE (PF) 100 MCG/2ML IJ SOLN
INTRAMUSCULAR | Status: AC
  Filled 2015-12-07: qty 2

## 2015-12-07 MED ORDER — FENTANYL CITRATE (PF) 100 MCG/2ML IJ SOLN: 25.0000 ug | INTRAMUSCULAR | Status: DC | PRN

## 2015-12-07 MED ORDER — LIDOCAINE HCL 2 % IJ SOLN
INTRAMUSCULAR | Status: AC
  Filled 2015-12-07: qty 20

## 2015-12-07 MED ORDER — PROPOFOL 500 MG/50ML IV EMUL
INTRAVENOUS | Status: DC | PRN
  Administered 2015-12-07: 40 mg via INTRAVENOUS

## 2015-12-07 MED ORDER — PROPOFOL 10 MG/ML IV BOLUS
INTRAVENOUS | Status: AC
  Filled 2015-12-07: qty 40

## 2015-12-07 MED ORDER — PROPOFOL 500 MG/50ML IV EMUL
INTRAVENOUS | Status: DC | PRN
  Administered 2015-12-07: 100 ug/kg/min via INTRAVENOUS

## 2015-12-07 MED ORDER — ONDANSETRON HCL 4 MG/2ML IJ SOLN
INTRAMUSCULAR | Status: DC | PRN
  Administered 2015-12-07: 4 mg via INTRAVENOUS

## 2015-12-07 MED ORDER — DEXTROSE 5 % IV SOLN
INTRAVENOUS | Status: AC
  Filled 2015-12-07: qty 2

## 2015-12-07 MED ORDER — DEXTROSE 5 % IV SOLN
2.0000 g | INTRAVENOUS | Status: AC
  Administered 2015-12-07: 2 g via INTRAVENOUS

## 2015-12-07 MED ORDER — LACTATED RINGERS IV SOLN
INTRAVENOUS | Status: DC | PRN
  Administered 2015-12-07: 10:00:00 via INTRAVENOUS

## 2015-12-07 MED ORDER — ONDANSETRON HCL 4 MG/2ML IJ SOLN
INTRAMUSCULAR | Status: AC
  Filled 2015-12-07: qty 2

## 2015-12-07 MED ORDER — LIDOCAINE HCL 2 % EX GEL
CUTANEOUS | Status: AC
  Filled 2015-12-07: qty 5

## 2015-12-07 MED ORDER — LIDOCAINE HCL 2 % IJ SOLN
INTRAMUSCULAR | Status: DC | PRN
  Administered 2015-12-07: 10 mL

## 2015-12-07 MED ORDER — MIDAZOLAM HCL 5 MG/5ML IJ SOLN
INTRAMUSCULAR | Status: DC | PRN
  Administered 2015-12-07: 2 mg via INTRAVENOUS

## 2015-12-07 MED ORDER — LIDOCAINE HCL (CARDIAC) 20 MG/ML IV SOLN
INTRAVENOUS | Status: AC
  Filled 2015-12-07: qty 5

## 2015-12-07 MED ORDER — EPHEDRINE SULFATE 50 MG/ML IJ SOLN
INTRAMUSCULAR | Status: AC
  Filled 2015-12-07: qty 1

## 2015-12-07 MED ORDER — FENTANYL CITRATE (PF) 100 MCG/2ML IJ SOLN
INTRAMUSCULAR | Status: DC | PRN
  Administered 2015-12-07: 50 ug via INTRAVENOUS

## 2015-12-07 MED ORDER — LIDOCAINE HCL 2 % EX GEL
CUTANEOUS | Status: DC | PRN
  Administered 2015-12-07: 1 via TOPICAL

## 2015-12-07 SURGICAL SUPPLY — 2 items
SYR CONTROL 10ML LL (SYRINGE) ×1 IMPLANT
UNDERPAD 30X30 INCONTINENT (UNDERPADS AND DIAPERS) ×2 IMPLANT

## 2015-12-07 NOTE — Transfer of Care (Signed)
Immediate Anesthesia Transfer of Care Note  Patient: Keith Beckmann, MD  Procedure(s) Performed: Procedure(s): BIOPSY TRANSRECTAL ULTRASONIC PROSTATE (TUBP) (N/A)  Patient Location: PACU  Anesthesia Type:MAC  Level of Consciousness: awake, alert  and oriented  Airway & Oxygen Therapy: Patient Spontanous Breathing and Patient connected to face mask oxygen  Post-op Assessment: Report given to RN and Post -op Vital signs reviewed and stable  Post vital signs: Reviewed and stable  Last Vitals:  Filed Vitals:   12/07/15 0813  BP: 130/69  Pulse: 53  Temp: 36.4 C  Resp: 16    Complications: No apparent anesthesia complications

## 2015-12-07 NOTE — Discharge Instructions (Signed)
1. You will likely have some blood in the urine and from the rectum with bowel movements for about 2-3 days.  This is normal and not concerning as long as it is not excessive. 2. You may experience some increased difficulty urinating although this should resolve within 24-48 hours.  If you find that it feels difficult to empty the bladder well or you cannot empty your bladder, you should call our office at [phone]. 3. You should take your antibiotic tomorrow as discussed.  You may resume aspirin and ibuprofen in 48 hours as long as the blood in the urine and from the rectum has stopped. 4. Your biopsy results should be back next week and Dr. Alinda Money will call you with the results.

## 2015-12-07 NOTE — Op Note (Signed)
Preoperative diagnosis:  1. Elevated PSA  Postoperative diagnosis: 1. Elevated PSA  Procedure(s): 1. Transrectal ultrasound of the prostate 2. Prostate Needle Biopsy  Surgeon: Keith [name], Keith [name]  Anesthesia: IV sedation, local anesthesia  Complications: None  EBL: Minimal  Specimens: 1. Right lateral base prostate 2. Right base prostate 3. Right lateral mid prostate 4. Right mid prostate 5. Right lateral apex prostate 6. Right apex prostate 7. Left lateral base prostate 8. Left base prostate 9. Left lateral mid prostate 10. Left mid prostate 11. Left lateral apex prostate 12. Left apex prostate  Disposition of specimens: Pathology  Indication: Keith [name] is a gentleman with an elevated PSA.  After reviewing options, he elected to proceed with a prostate biopsy under ultrasound guidance.  I discussed the potential benefits and risks of the procedure, side effects of the proposed treatment, the likelihood of the patient achieving the goals of the procedure, and any potential problems that might occur during the procedure or recuperation. He gives informed consent to proceed.  Description of procedure:  The patient was taken to the OR and administered pre-procedure antibiotics.  He had performed a preoperative enema as instructed.  He was given IV sedation by anesthesia and placed in the left lateral decubitus position. A digital rectal exam was performed and no nodularity or induration was noted.  He was administered lidocaine jelly to the rectal wall and the 10 MHz transrectal ultrasound probe was then inserted into the rectum. The prostate was visualized in the transverse and sagittal imaging views.  The prostate appeared very enlarged and was homogenous without discrete ultrasound abnormalities.  No median lobe was noted.  The prostate was measured at 165.2 cc.  12 core biopsies were then obtained all under direct transrectal ultrasound guidance.  Biopsies were  obtained from the lateral and parasagittal regions of the right and left base, mid, and apex portions of the glands using a standard sextant template with the exact locations as identified above.  A digital rectal exam at the conclusion of the procedure revealed no significant bleeding.    He was transported to the recovery unit in stable condition.  He tolerated the procedure well and without complications.

## 2015-12-07 NOTE — Anesthesia Postprocedure Evaluation (Signed)
Anesthesia Post Note  Patient: Keith Beckmann, MD  Procedure(s) Performed: Procedure(s) (LRB): BIOPSY TRANSRECTAL ULTRASONIC PROSTATE (TUBP) (N/A)  Patient location during evaluation: PACU Anesthesia Type: MAC Level of consciousness: awake and alert Pain management: pain level controlled Vital Signs Assessment: post-procedure vital signs reviewed and stable Respiratory status: spontaneous breathing, nonlabored ventilation, respiratory function stable and patient connected to nasal cannula oxygen Cardiovascular status: stable and blood pressure returned to baseline Anesthetic complications: no    Last Vitals:  Filed Vitals:   12/07/15 1123 12/07/15 1200  BP: 127/55 125/71  Pulse: 59 50  Temp: 36.7 C   Resp: 16 16    Last Pain: There were no vitals filed for this visit.               Breanna Shorkey J

## 2015-12-07 NOTE — Anesthesia Preprocedure Evaluation (Addendum)
Anesthesia Evaluation  Patient identified by MRN, date of birth, ID band Patient awake    Reviewed: Allergy & Precautions, NPO status , Patient's Chart, lab work & pertinent test results  History of Anesthesia Complications (+) DIFFICULT AIRWAY and history of anesthetic complications  Airway Mallampati: II  TM Distance: >3 FB Neck ROM: Limited    Dental no notable dental hx.    Pulmonary neg pulmonary ROS, asthma ,    Pulmonary exam normal breath sounds clear to auscultation       Cardiovascular Exercise Tolerance: Good hypertension, Pt. on medications and Pt. on home beta blockers Normal cardiovascular exam+ dysrhythmias  Rhythm:Regular Rate:Normal     Neuro/Psych PSYCHIATRIC DISORDERS Depression negative neurological ROS     GI/Hepatic Neg liver ROS, GERD  Medicated,  Endo/Other  negative endocrine ROS  Renal/GU negative Renal ROS  negative genitourinary   Musculoskeletal negative musculoskeletal ROS (+)   Abdominal   Peds negative pediatric ROS (+)  Hematology negative hematology ROS (+)   Anesthesia Other Findings   Reproductive/Obstetrics negative OB ROS                            Anesthesia Physical Anesthesia Plan  ASA: II  Anesthesia Plan: MAC   Post-op Pain Management:    Induction: Intravenous  Airway Management Planned:   Additional Equipment:   Intra-op Plan:   Post-operative Plan: Extubation in OR  Informed Consent: I have reviewed the patients History and Physical, chart, labs and discussed the procedure including the risks, benefits and alternatives for the proposed anesthesia with the patient or authorized representative who has indicated his/her understanding and acceptance.   Dental advisory given  Plan Discussed with: CRNA  Anesthesia Plan Comments:         Anesthesia Quick Evaluation

## 2016-03-11 ENCOUNTER — Ambulatory Visit
Admission: RE | Admit: 2016-03-11 | Discharge: 2016-03-11 | Disposition: A | Discharge status: Home / Self Care | Payer: Medicare Other | Source: Ambulatory Visit | Attending: Physician Assistant | Admitting: Physician Assistant

## 2016-03-11 ENCOUNTER — Other Ambulatory Visit: Payer: Self-pay | Admitting: Physician Assistant

## 2016-03-11 DIAGNOSIS — R109 Unspecified abdominal pain: Secondary | ICD-10-CM

## 2016-03-20 ENCOUNTER — Emergency Department (HOSPITAL_COMMUNITY)
Admission: EM | Admit: 2016-03-20 | Discharge: 2016-03-20 | Disposition: A | Discharge status: Home / Self Care | Payer: Medicare Other | Attending: Emergency Medicine | Admitting: Emergency Medicine

## 2016-03-20 ENCOUNTER — Encounter (HOSPITAL_COMMUNITY): Payer: Self-pay | Admitting: Emergency Medicine

## 2016-03-20 DIAGNOSIS — E7800 Pure hypercholesterolemia, unspecified: Secondary | ICD-10-CM | POA: Insufficient documentation

## 2016-03-20 DIAGNOSIS — N401 Enlarged prostate with lower urinary tract symptoms: Secondary | ICD-10-CM | POA: Diagnosis not present

## 2016-03-20 DIAGNOSIS — M1990 Unspecified osteoarthritis, unspecified site: Secondary | ICD-10-CM | POA: Insufficient documentation

## 2016-03-20 DIAGNOSIS — Z792 Long term (current) use of antibiotics: Secondary | ICD-10-CM | POA: Diagnosis not present

## 2016-03-20 DIAGNOSIS — R1030 Lower abdominal pain, unspecified: Secondary | ICD-10-CM

## 2016-03-20 DIAGNOSIS — R1031 Right lower quadrant pain: Secondary | ICD-10-CM | POA: Diagnosis present

## 2016-03-20 DIAGNOSIS — I10 Essential (primary) hypertension: Secondary | ICD-10-CM | POA: Insufficient documentation

## 2016-03-20 DIAGNOSIS — F4310 Post-traumatic stress disorder, unspecified: Secondary | ICD-10-CM | POA: Insufficient documentation

## 2016-03-20 DIAGNOSIS — J45909 Unspecified asthma, uncomplicated: Secondary | ICD-10-CM | POA: Diagnosis not present

## 2016-03-20 DIAGNOSIS — Z79899 Other long term (current) drug therapy: Secondary | ICD-10-CM | POA: Diagnosis not present

## 2016-03-20 DIAGNOSIS — F329 Major depressive disorder, single episode, unspecified: Secondary | ICD-10-CM | POA: Diagnosis not present

## 2016-03-20 DIAGNOSIS — K219 Gastro-esophageal reflux disease without esophagitis: Secondary | ICD-10-CM | POA: Insufficient documentation

## 2016-03-20 DIAGNOSIS — K5732 Diverticulitis of large intestine without perforation or abscess without bleeding: Secondary | ICD-10-CM | POA: Diagnosis not present

## 2016-03-20 DIAGNOSIS — R350 Frequency of micturition: Secondary | ICD-10-CM | POA: Diagnosis not present

## 2016-03-20 LAB — CBC
HCT: 41.3 % (ref 39.0–52.0)
HEMOGLOBIN: 14.8 g/dL (ref 13.0–17.0)
MCH: 30.6 pg (ref 26.0–34.0)
MCHC: 35.8 g/dL (ref 30.0–36.0)
MCV: 85.3 fL (ref 78.0–100.0)
Platelets: 332 10*3/uL (ref 150–400)
RBC: 4.84 MIL/uL (ref 4.22–5.81)
RDW: 12.4 % (ref 11.5–15.5)
WBC: 5.3 10*3/uL (ref 4.0–10.5)

## 2016-03-20 LAB — URINALYSIS, ROUTINE W REFLEX MICROSCOPIC
Bilirubin Urine: NEGATIVE
Glucose, UA: NEGATIVE mg/dL
Ketones, ur: NEGATIVE mg/dL
LEUKOCYTES UA: NEGATIVE
Nitrite: NEGATIVE
PROTEIN: NEGATIVE mg/dL
SPECIFIC GRAVITY, URINE: 1.022 (ref 1.005–1.030)
pH: 6.5 (ref 5.0–8.0)

## 2016-03-20 LAB — COMPREHENSIVE METABOLIC PANEL
ALT: 22 U/L (ref 17–63)
ANION GAP: 9 (ref 5–15)
AST: 30 U/L (ref 15–41)
Albumin: 4.1 g/dL (ref 3.5–5.0)
Alkaline Phosphatase: 56 U/L (ref 38–126)
BUN: 14 mg/dL (ref 6–20)
CALCIUM: 9.5 mg/dL (ref 8.9–10.3)
CHLORIDE: 107 mmol/L (ref 101–111)
CO2: 27 mmol/L (ref 22–32)
Creatinine, Ser: 1.11 mg/dL (ref 0.61–1.24)
Glucose, Bld: 121 mg/dL — ABNORMAL HIGH (ref 65–99)
Potassium: 4.2 mmol/L (ref 3.5–5.1)
SODIUM: 143 mmol/L (ref 135–145)
Total Bilirubin: 0.9 mg/dL (ref 0.3–1.2)
Total Protein: 7 g/dL (ref 6.5–8.1)

## 2016-03-20 LAB — URINE MICROSCOPIC-ADD ON: BACTERIA UA: NONE SEEN

## 2016-03-20 LAB — LIPASE, BLOOD: LIPASE: 45 U/L (ref 11–51)

## 2016-03-20 NOTE — ED Notes (Signed)
Sigmoid colectomy scheduled for mid June for a small perforated bowel from sigmoid diverticulitis. The pain originally had subsided, but began getting worse yesterday morning. Unable to get in to PCP office. Chills last night. Suprapubic pain, denies weakness/dizziness currently. Mild nausea, states it's making him feel like he has to urinate.

## 2016-03-20 NOTE — Discharge Instructions (Signed)
Stop taking your augmentin and start taking Ciprofloxacin and Flagyl as directed and until completed. Please take all of your antibiotics until finished!   You may develop abdominal discomfort or diarrhea from the antibiotic.  You may help offset this with probiotics which you can buy or get in yogurt. Do not eat  or take the probiotics until 2 hours after your antibiotic. Use tylenol or motrin as needed for pain. Stay well hydrated. Follow up with your surgeon Dr. Marlou Starks next week for ongoing management of your diverticulitis. Return to the ER for changes or worsening symptoms.   Diverticulitis Diverticulitis is when small pockets that have formed in your colon (large intestine) become infected or swollen. HOME CARE  Follow your doctor's instructions.  Follow a special diet if told by your doctor.  When you feel better, your doctor may tell you to change your diet. You may be told to eat a lot of fiber. Fruits and vegetables are good sources of fiber. Fiber makes it easier to poop (have bowel movements).  Take supplements or probiotics as told by your doctor.  Only take medicines as told by your doctor.  Keep all follow-up visits with your doctor. GET HELP IF:  Your pain does not get better.  You have a hard time eating food.  You are not pooping like normal. GET HELP RIGHT AWAY IF:  Your pain gets worse.  Your problems do not get better.  Your problems suddenly get worse.  You have a fever.  You keep throwing up (vomiting).  You have bloody or black, tarry poop (stool). MAKE SURE YOU:   Understand these instructions.  Will watch your condition.  Will get help right away if you are not doing well or get worse.   This information is not intended to replace advice given to you by your health care provider. Make sure you discuss any questions you have with your health care provider.   Document Released: 04/15/2008 Document Revised: 11/02/2013 Document Reviewed:  09/22/2013 Elsevier Interactive Patient Education [employer].

## 2016-03-20 NOTE — ED Notes (Signed)
Bed: WTR7 Expected date:  Expected time:  Means of arrival:  Comments: Lab draw

## 2016-03-20 NOTE — Progress Notes (Signed)
Pt seen and hospitalized 12/18-12/22/16 with diverticulitis with microperforation.  He has done well at home with a recurrent bout that started on 4/28 or 03/09/2016.  He started on Augmentin at that time.  He got a CT scan on 03/11/16 which showed the abscess was slightly larger at 3.5 cm.  Up from last CT that showed it to be 2.0 x 1.8 cm.  He saw Dr. Marlou Starks on 03/13/16 and was feeling better at that time.  He called today and was having more urinary symptoms, and some mid lower abdominal pain.  He was referred to the ED, where labs were normal.   CBC Latest Ref Rng 03/20/2016 12/04/2015 11/01/2015  WBC 4.0 - 10.5 K/uL 5.3 6.7 5.5  Hemoglobin 13.0 - 17.0 g/dL 14.8 15.1 13.8  Hematocrit 39.0 - 52.0 % 41.3 43.0 40.2  Platelets 150 - 400 K/uL 332 284 278   CMP Latest Ref Rng 03/20/2016 12/04/2015 11/01/2015  Glucose 65 - 99 mg/dL 121(H) 104(H) 114(H)  BUN 6 - 20 mg/dL 14 15 7   Creatinine 0.61 - 1.24 mg/dL 1.11 1.17 1.10  Sodium 135 - 145 mmol/L 143 142 141  Potassium 3.5 - 5.1 mmol/L 4.2 4.5 3.7  Chloride 101 - 111 mmol/L 107 105 105  CO2 22 - 32 mmol/L 27 28 29   Calcium 8.9 - 10.3 mg/dL 9.5 9.4 9.2  Total Protein 6.5 - 8.1 g/dL 7.0 - -  Total Bilirubin 0.3 - 1.2 mg/dL 0.9 - -  Alkaline Phos 38 - 126 U/L 56 - -  AST 15 - 41 U/L 30 - -  ALT 17 - 63 U/L 22 - -   He denies fever, he had chills one evening after [place], but no other issues. No nausea or vomiting. He is eating without issues and BM's are essentially normal.  He voids more frequently than normal, but some of that is related to his prostate. He is afebrile, and VSS.   Exam: BP 125/72 mmHg  Pulse 50  Temp(Src) 98.5 F (36.9 C) (Oral)  Resp 16  SpO2 97% HEENT: WNL Cardiac:  Bradycardia _ on lopressor Chest:  Clear Abd: soft, nontender, + BS, some tenderness lower mid abdomen and this also make him want to void. No peritonitis.    Pt seen and evaluated by DR. Gerkin.  It is his opinion pt can go home on   Cipro/Flagyl and continue this till he see's Dr. Marlou Starks in 2 weeks.  If he has an issue he will call and come back sooner. Pt has been doing Primary care for years and knows all the issues involved.  He is already scheduled to see Dr. Marlou Starks in about 2 weeks.    Rx: Cipro 750 BID # 30 1 refill, Flagyl 500 mg PO q8h, # 42, 1 refill.
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
## 2016-04-04 ENCOUNTER — Other Ambulatory Visit: Payer: Self-pay | Admitting: General Surgery

## 2016-04-16 ENCOUNTER — Encounter (HOSPITAL_COMMUNITY): Payer: Self-pay

## 2016-04-16 ENCOUNTER — Encounter (HOSPITAL_COMMUNITY)
Admission: RE | Admit: 2016-04-16 | Discharge: 2016-04-16 | Disposition: A | Discharge status: Home / Self Care | Payer: Medicare Other | Source: Ambulatory Visit | Attending: General Surgery | Admitting: General Surgery

## 2016-04-16 HISTORY — DX: Personal history of other malignant neoplasm of skin: Z85.828

## 2016-04-16 LAB — TYPE AND SCREEN
ABO/RH(D): O POS
Antibody Screen: NEGATIVE

## 2016-04-16 LAB — BASIC METABOLIC PANEL
Anion gap: 8 (ref 5–15)
BUN: 8 mg/dL (ref 6–20)
CHLORIDE: 106 mmol/L (ref 101–111)
CO2: 26 mmol/L (ref 22–32)
CREATININE: 1.11 mg/dL (ref 0.61–1.24)
Calcium: 9.8 mg/dL (ref 8.9–10.3)
GFR calc Af Amer: >60 mL/min (ref >60)
GFR calc non Af Amer: >60 mL/min (ref >60)
GLUCOSE: 99 mg/dL (ref 65–99)
POTASSIUM: 4.6 mmol/L (ref 3.5–5.1)
Sodium: 140 mmol/L (ref 135–145)

## 2016-04-16 LAB — CBC
HEMATOCRIT: 46.1 % (ref 39.0–52.0)
Hemoglobin: 15.9 g/dL (ref 13.0–17.0)
MCH: 29.7 pg (ref 26.0–34.0)
MCHC: 34.5 g/dL (ref 30.0–36.0)
MCV: 86.2 fL (ref 78.0–100.0)
PLATELETS: 290 10*3/uL (ref 150–400)
RBC: 5.35 MIL/uL (ref 4.22–5.81)
RDW: 12.8 % (ref 11.5–15.5)
WBC: 7.1 10*3/uL (ref 4.0–10.5)

## 2016-04-16 LAB — ABO/RH: ABO/RH(D): O POS

## 2016-04-16 NOTE — Pre-Procedure Instructions (Signed)
[name], MD  04/16/2016      [facility] [address] [place], [place] [address] Phone: [phone] Fax: [phone]    Your procedure is scheduled on Thursday, June 8th, 2017.  Report to [facility] Admitting at 10:30 A.M.   Call this number if you have problems the morning of surgery:  [phone]   Remember:  Do not eat food or drink liquids after midnight.   Take these medicines the morning of surgery with A SIP OF WATER: Amlodipine (Norvasc), Bupropion (Wellbutrin), Metoprolol Succinate (Toprol-XL), Ranitidine (Zantac).   Stop taking: Aspirin, NSAIDS, Aleve, Naproxen, Ibuprofen, Advil, Motrin, BC's, Goody's, Fish oil, all herbal medications, and all vitamins.   Do not wear jewelry.  Do not wear lotions, powders, or colognes.    Men may shave face and neck.  Do not bring valuables to the hospital.   [facility] is not responsible for any belongings or valuables.  Contacts, dentures or bridgework may not be worn into surgery.  Leave your suitcase in the car.  After surgery it may be brought to your room.  For patients admitted to the hospital, discharge time will be determined by your treatment team.  Patients discharged the day of surgery will not be allowed to drive home.   Special instructions:  See attached.   Please read over the following fact sheets that you were given. Colon Bowel Prep     [place]- Preparing For Surgery  Before surgery, you can play an important role. Because skin is not sterile, your skin needs to be as free of germs as possible. You can reduce the number of germs on your skin by washing with CHG (chlorahexidine gluconate) Soap before surgery.  CHG is an antiseptic cleaner which kills germs and bonds with the skin to continue killing germs even after washing.  Please do not use if you have an allergy to CHG or antibacterial soaps. If your skin  becomes reddened/irritated stop using the CHG.  Do not shave (including legs and underarms) for at least 48 hours prior to first CHG shower. It is OK to shave your face.  Please follow these instructions carefully.   1. Shower the NIGHT BEFORE SURGERY and the MORNING OF SURGERY with CHG.   2. If you chose to wash your hair, wash your hair first as usual with your normal shampoo.  3. After you shampoo, rinse your hair and body thoroughly to remove the shampoo.  4. Use CHG as you would any other liquid soap. You can apply CHG directly to the skin and wash gently with a scrungie or a clean washcloth.   5. Apply the CHG Soap to your body ONLY FROM THE NECK DOWN.  Do not use on open wounds or open sores. Avoid contact with your eyes, ears, mouth and genitals (private parts). Wash genitals (private parts) with your normal soap.  6. Wash thoroughly, paying special attention to the area where your surgery will be performed.  7. Thoroughly rinse your body with warm water from the neck down.  8. DO NOT shower/wash with your normal soap after using and rinsing off the CHG Soap.  9. Pat yourself dry with a CLEAN TOWEL.   10. Wear CLEAN PAJAMAS   11. Place CLEAN SHEETS on your bed the night of your first shower and DO NOT SLEEP WITH PETS.    Day of Surgery: Do not  apply any deodorants/lotions. Please wear clean clothes to the hospital/surgery center.

## 2016-04-16 NOTE — Progress Notes (Addendum)
PCP - Dr. Gaynelle Arabian Cardiologist - Dr. Einar Gip  EKG - 10/26/15; requested more recent EKG from Dr. Einar Gip CXR - denies  Echo/Stress Test/Cardiac Cath - requested records from Dr. Einar Gip  Patient denies chest pain and shortness of breath at PAT appointment.    Keith Gianotti, PA consulted at PAT appointment due to difficult intubation, limited neck mobility.    Patient verbalized understanding of bowel prep prior to surgery.
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
## 2016-04-18 ENCOUNTER — Inpatient Hospital Stay (HOSPITAL_COMMUNITY)
Admission: RE | Admit: 2016-04-18 | Discharge: 2016-04-24 | DRG: 331 | Disposition: A | Discharge status: Home / Self Care | Payer: Medicare Other | Source: Ambulatory Visit | Attending: General Surgery | Admitting: General Surgery

## 2016-04-18 ENCOUNTER — Encounter (HOSPITAL_COMMUNITY)
Admission: RE | Disposition: A | Discharge status: Home / Self Care | Payer: Self-pay | Source: Ambulatory Visit | Attending: General Surgery

## 2016-04-18 ENCOUNTER — Inpatient Hospital Stay (HOSPITAL_COMMUNITY): Payer: Medicare Other | Admitting: Certified Registered"

## 2016-04-18 ENCOUNTER — Inpatient Hospital Stay (HOSPITAL_COMMUNITY): Payer: Medicare Other | Admitting: Vascular Surgery

## 2016-04-18 ENCOUNTER — Encounter (HOSPITAL_COMMUNITY): Payer: Self-pay | Admitting: *Deleted

## 2016-04-18 DIAGNOSIS — Z981 Arthrodesis status: Secondary | ICD-10-CM

## 2016-04-18 DIAGNOSIS — K5732 Diverticulitis of large intestine without perforation or abscess without bleeding: Secondary | ICD-10-CM | POA: Diagnosis present

## 2016-04-18 DIAGNOSIS — N401 Enlarged prostate with lower urinary tract symptoms: Secondary | ICD-10-CM | POA: Diagnosis present

## 2016-04-18 DIAGNOSIS — I10 Essential (primary) hypertension: Secondary | ICD-10-CM | POA: Diagnosis present

## 2016-04-18 DIAGNOSIS — S0500XA Injury of conjunctiva and corneal abrasion without foreign body, unspecified eye, initial encounter: Secondary | ICD-10-CM | POA: Diagnosis not present

## 2016-04-18 DIAGNOSIS — F4310 Post-traumatic stress disorder, unspecified: Secondary | ICD-10-CM | POA: Diagnosis present

## 2016-04-18 DIAGNOSIS — K219 Gastro-esophageal reflux disease without esophagitis: Secondary | ICD-10-CM | POA: Diagnosis present

## 2016-04-18 DIAGNOSIS — R109 Unspecified abdominal pain: Secondary | ICD-10-CM | POA: Diagnosis present

## 2016-04-18 DIAGNOSIS — Z7982 Long term (current) use of aspirin: Secondary | ICD-10-CM | POA: Diagnosis not present

## 2016-04-18 DIAGNOSIS — J45909 Unspecified asthma, uncomplicated: Secondary | ICD-10-CM | POA: Diagnosis present

## 2016-04-18 DIAGNOSIS — Z85828 Personal history of other malignant neoplasm of skin: Secondary | ICD-10-CM | POA: Diagnosis not present

## 2016-04-18 DIAGNOSIS — E7800 Pure hypercholesterolemia, unspecified: Secondary | ICD-10-CM | POA: Diagnosis present

## 2016-04-18 HISTORY — PX: LAPAROSCOPIC SIGMOID COLECTOMY: SHX5928

## 2016-04-18 SURGERY — COLECTOMY, SIGMOID, LAPAROSCOPIC
Anesthesia: General | Site: Abdomen

## 2016-04-18 MED ORDER — LIDOCAINE HCL (CARDIAC) 20 MG/ML IV SOLN
INTRAVENOUS | Status: DC | PRN
  Administered 2016-04-18: 60 mg via INTRAVENOUS

## 2016-04-18 MED ORDER — FENTANYL CITRATE (PF) 100 MCG/2ML IJ SOLN
INTRAMUSCULAR | Status: DC | PRN
  Administered 2016-04-18: 100 ug via INTRAVENOUS
  Administered 2016-04-18 (×2): 50 ug via INTRAVENOUS

## 2016-04-18 MED ORDER — SUGAMMADEX SODIUM 200 MG/2ML IV SOLN
INTRAVENOUS | Status: AC
  Filled 2016-04-18: qty 2

## 2016-04-18 MED ORDER — METRONIDAZOLE IN NACL 5-0.79 MG/ML-% IV SOLN
500.0000 mg | INTRAVENOUS | Status: AC
  Administered 2016-04-18: 500 mg via INTRAVENOUS
  Filled 2016-04-18: qty 100

## 2016-04-18 MED ORDER — MIDAZOLAM HCL 2 MG/2ML IJ SOLN
INTRAMUSCULAR | Status: AC
  Filled 2016-04-18: qty 2

## 2016-04-18 MED ORDER — ONDANSETRON HCL 4 MG/2ML IJ SOLN
INTRAMUSCULAR | Status: DC | PRN
  Administered 2016-04-18: 4 mg via INTRAVENOUS

## 2016-04-18 MED ORDER — MORPHINE SULFATE 2 MG/ML IV SOLN
INTRAVENOUS | Status: DC
  Administered 2016-04-18: 16:00:00 via INTRAVENOUS
  Administered 2016-04-18: 0 mg via INTRAVENOUS
  Administered 2016-04-19 (×3): 1.5 mg via INTRAVENOUS
  Administered 2016-04-20: 4.5 mg via INTRAVENOUS
  Administered 2016-04-20: 1.5 mg via INTRAVENOUS
  Administered 2016-04-21: 0.75 mg via INTRAVENOUS

## 2016-04-18 MED ORDER — HYDROMORPHONE HCL 1 MG/ML IJ SOLN
INTRAMUSCULAR | Status: AC
  Administered 2016-04-18: 0.5 mg via INTRAVENOUS
  Filled 2016-04-18: qty 1

## 2016-04-18 MED ORDER — PROPOFOL 10 MG/ML IV BOLUS
INTRAVENOUS | Status: AC
  Filled 2016-04-18: qty 20

## 2016-04-18 MED ORDER — MORPHINE SULFATE (PF) 2 MG/ML IV SOLN: 1.0000 mg | INTRAVENOUS | Status: DC | PRN

## 2016-04-18 MED ORDER — POVIDONE-IODINE 10 % EX OINT
TOPICAL_OINTMENT | CUTANEOUS | Status: DC | PRN
  Administered 2016-04-18: 1 via TOPICAL

## 2016-04-18 MED ORDER — ONDANSETRON HCL 4 MG/2ML IJ SOLN: 4.0000 mg | Freq: Once | INTRAMUSCULAR | Status: DC | PRN

## 2016-04-18 MED ORDER — DEXAMETHASONE SODIUM PHOSPHATE 10 MG/ML IJ SOLN
INTRAMUSCULAR | Status: DC | PRN
  Administered 2016-04-18: 5 mg via INTRAVENOUS

## 2016-04-18 MED ORDER — ONDANSETRON 4 MG PO TBDP: 4.0000 mg | ORAL_TABLET | Freq: Four times a day (QID) | ORAL | Status: DC | PRN

## 2016-04-18 MED ORDER — MORPHINE SULFATE 2 MG/ML IV SOLN
INTRAVENOUS | Status: AC
  Filled 2016-04-18: qty 25

## 2016-04-18 MED ORDER — ALVIMOPAN 12 MG PO CAPS
12.0000 mg | ORAL_CAPSULE | Freq: Two times a day (BID) | ORAL | Status: DC
  Administered 2016-04-19 – 2016-04-23 (×10): 12 mg via ORAL
  Filled 2016-04-18 (×10): qty 1

## 2016-04-18 MED ORDER — KCL IN DEXTROSE-NACL 20-5-0.9 MEQ/L-%-% IV SOLN
INTRAVENOUS | Status: DC
  Administered 2016-04-18 – 2016-04-23 (×7): via INTRAVENOUS
  Filled 2016-04-18 (×10): qty 1000

## 2016-04-18 MED ORDER — NALOXONE HCL 0.4 MG/ML IJ SOLN
0.4000 mg | INTRAMUSCULAR | Status: DC | PRN
  Filled 2016-04-18: qty 1

## 2016-04-18 MED ORDER — SURGILUBE EX GEL
CUTANEOUS | Status: DC | PRN
  Administered 2016-04-18: 1 via TOPICAL

## 2016-04-18 MED ORDER — CIPROFLOXACIN IN D5W 400 MG/200ML IV SOLN
INTRAVENOUS | Status: AC
  Administered 2016-04-18: 400 mg via INTRAVENOUS
  Filled 2016-04-18: qty 200

## 2016-04-18 MED ORDER — PHENYLEPHRINE HCL 10 MG/ML IJ SOLN
INTRAMUSCULAR | Status: DC | PRN
  Administered 2016-04-18 (×3): 80 ug via INTRAVENOUS
  Administered 2016-04-18: 160 ug via INTRAVENOUS
  Administered 2016-04-18: 80 ug via INTRAVENOUS
  Administered 2016-04-18: 120 ug via INTRAVENOUS
  Administered 2016-04-18: 160 ug via INTRAVENOUS
  Administered 2016-04-18: 40 ug via INTRAVENOUS

## 2016-04-18 MED ORDER — ALVIMOPAN 12 MG PO CAPS
12.0000 mg | ORAL_CAPSULE | Freq: Once | ORAL | Status: AC
  Administered 2016-04-18: 12 mg via ORAL

## 2016-04-18 MED ORDER — SUCCINYLCHOLINE CHLORIDE 200 MG/10ML IV SOSY
PREFILLED_SYRINGE | INTRAVENOUS | Status: AC
  Filled 2016-04-18: qty 10

## 2016-04-18 MED ORDER — MIDAZOLAM HCL 5 MG/5ML IJ SOLN
INTRAMUSCULAR | Status: DC | PRN
  Administered 2016-04-18: 2 mg via INTRAVENOUS

## 2016-04-18 MED ORDER — ROCURONIUM BROMIDE 100 MG/10ML IV SOLN
INTRAVENOUS | Status: DC | PRN
  Administered 2016-04-18: 10 mg via INTRAVENOUS
  Administered 2016-04-18: 20 mg via INTRAVENOUS
  Administered 2016-04-18: 40 mg via INTRAVENOUS

## 2016-04-18 MED ORDER — ALVIMOPAN 12 MG PO CAPS
ORAL_CAPSULE | ORAL | Status: AC
  Administered 2016-04-18: 12 mg via ORAL
  Filled 2016-04-18: qty 1

## 2016-04-18 MED ORDER — FENTANYL CITRATE (PF) 250 MCG/5ML IJ SOLN
INTRAMUSCULAR | Status: AC
  Filled 2016-04-18: qty 5

## 2016-04-18 MED ORDER — DIPHENHYDRAMINE HCL 50 MG/ML IJ SOLN
12.5000 mg | Freq: Four times a day (QID) | INTRAMUSCULAR | Status: DC | PRN
  Filled 2016-04-18: qty 0.25

## 2016-04-18 MED ORDER — BUPIVACAINE-EPINEPHRINE (PF) 0.25% -1:200000 IJ SOLN
INTRAMUSCULAR | Status: AC
  Filled 2016-04-18: qty 30

## 2016-04-18 MED ORDER — ONDANSETRON HCL 4 MG/2ML IJ SOLN
4.0000 mg | Freq: Four times a day (QID) | INTRAMUSCULAR | Status: DC | PRN
  Administered 2016-04-20: 4 mg via INTRAVENOUS
  Filled 2016-04-18: qty 2

## 2016-04-18 MED ORDER — LACTATED RINGERS IV SOLN
Freq: Once | INTRAVENOUS | Status: AC
  Administered 2016-04-18: 11:00:00 via INTRAVENOUS

## 2016-04-18 MED ORDER — EPHEDRINE 5 MG/ML INJ
INTRAVENOUS | Status: AC
  Filled 2016-04-18: qty 20

## 2016-04-18 MED ORDER — EPHEDRINE SULFATE 50 MG/ML IJ SOLN
INTRAMUSCULAR | Status: DC | PRN
  Administered 2016-04-18: 10 mg via INTRAVENOUS
  Administered 2016-04-18: 25 mg via INTRAVENOUS
  Administered 2016-04-18: 10 mg via INTRAVENOUS
  Administered 2016-04-18: 15 mg via INTRAVENOUS
  Administered 2016-04-18 (×2): 20 mg via INTRAVENOUS

## 2016-04-18 MED ORDER — FAMOTIDINE IN NACL 20-0.9 MG/50ML-% IV SOLN
20.0000 mg | Freq: Two times a day (BID) | INTRAVENOUS | Status: DC
  Administered 2016-04-18 – 2016-04-22 (×8): 20 mg via INTRAVENOUS
  Filled 2016-04-18 (×9): qty 50

## 2016-04-18 MED ORDER — CIPROFLOXACIN IN D5W 400 MG/200ML IV SOLN: 400.0000 mg | INTRAVENOUS | Status: DC

## 2016-04-18 MED ORDER — PROPOFOL 10 MG/ML IV BOLUS
INTRAVENOUS | Status: DC | PRN
  Administered 2016-04-18: 150 mg via INTRAVENOUS

## 2016-04-18 MED ORDER — SUCCINYLCHOLINE CHLORIDE 20 MG/ML IJ SOLN
INTRAMUSCULAR | Status: DC | PRN
  Administered 2016-04-18: 100 mg via INTRAVENOUS

## 2016-04-18 MED ORDER — POVIDONE-IODINE 10 % EX OINT
TOPICAL_OINTMENT | CUTANEOUS | Status: AC
  Filled 2016-04-18: qty 28.35

## 2016-04-18 MED ORDER — METOPROLOL TARTRATE 5 MG/5ML IV SOLN
5.0000 mg | Freq: Four times a day (QID) | INTRAVENOUS | Status: DC
  Administered 2016-04-18 – 2016-04-22 (×14): 5 mg via INTRAVENOUS
  Filled 2016-04-18 (×15): qty 5

## 2016-04-18 MED ORDER — ONDANSETRON HCL 4 MG/2ML IJ SOLN
4.0000 mg | Freq: Four times a day (QID) | INTRAMUSCULAR | Status: DC | PRN
  Filled 2016-04-18: qty 2

## 2016-04-18 MED ORDER — BUPIVACAINE-EPINEPHRINE 0.25% -1:200000 IJ SOLN
INTRAMUSCULAR | Status: DC | PRN
  Administered 2016-04-18: 18 mL

## 2016-04-18 MED ORDER — HEPARIN SODIUM (PORCINE) 5000 UNIT/ML IJ SOLN
5000.0000 [IU] | Freq: Three times a day (TID) | INTRAMUSCULAR | Status: DC
  Administered 2016-04-19 – 2016-04-24 (×16): 5000 [IU] via SUBCUTANEOUS
  Filled 2016-04-18 (×15): qty 1

## 2016-04-18 MED ORDER — ROCURONIUM BROMIDE 50 MG/5ML IV SOLN
INTRAVENOUS | Status: AC
  Filled 2016-04-18: qty 2

## 2016-04-18 MED ORDER — SODIUM CHLORIDE 0.9% FLUSH: 9.0000 mL | INTRAVENOUS | Status: DC | PRN

## 2016-04-18 MED ORDER — PHENYLEPHRINE 40 MCG/ML (10ML) SYRINGE FOR IV PUSH (FOR BLOOD PRESSURE SUPPORT)
PREFILLED_SYRINGE | INTRAVENOUS | Status: AC
  Filled 2016-04-18: qty 10

## 2016-04-18 MED ORDER — DIPHENHYDRAMINE HCL 12.5 MG/5ML PO ELIX
12.5000 mg | ORAL_SOLUTION | Freq: Four times a day (QID) | ORAL | Status: DC | PRN
  Filled 2016-04-18: qty 5

## 2016-04-18 MED ORDER — SODIUM CHLORIDE 0.9 % IR SOLN
Status: DC | PRN
  Administered 2016-04-18: 1000 mL

## 2016-04-18 MED ORDER — SUGAMMADEX SODIUM 200 MG/2ML IV SOLN
INTRAVENOUS | Status: DC | PRN
  Administered 2016-04-18: 200 mg via INTRAVENOUS

## 2016-04-18 MED ORDER — CHLORHEXIDINE GLUCONATE 4 % EX LIQD: 1.0000 "application " | Freq: Once | CUTANEOUS | Status: DC

## 2016-04-18 MED ORDER — LACTATED RINGERS IV SOLN
INTRAVENOUS | Status: DC | PRN
  Administered 2016-04-18 (×3): via INTRAVENOUS

## 2016-04-18 MED ORDER — DEXAMETHASONE SODIUM PHOSPHATE 10 MG/ML IJ SOLN
INTRAMUSCULAR | Status: AC
  Filled 2016-04-18: qty 1

## 2016-04-18 MED ORDER — ONDANSETRON HCL 4 MG/2ML IJ SOLN
INTRAMUSCULAR | Status: AC
  Filled 2016-04-18: qty 2

## 2016-04-18 MED ORDER — HYDROMORPHONE HCL 1 MG/ML IJ SOLN
0.2500 mg | INTRAMUSCULAR | Status: DC | PRN
  Administered 2016-04-18 (×2): 0.5 mg via INTRAVENOUS

## 2016-04-18 SURGICAL SUPPLY — 83 items
APPLIER CLIP ROT 10 11.4 M/L (STAPLE)
APR CLP MED LRG 11.4X10 (STAPLE)
BLADE SURG ROTATE 9660 (MISCELLANEOUS) IMPLANT
CANISTER SUCTION 2500CC (MISCELLANEOUS) ×3 IMPLANT
CELLS DAT CNTRL 66122 CELL SVR (MISCELLANEOUS) IMPLANT
CHLORAPREP W/TINT 26ML (MISCELLANEOUS) ×2 IMPLANT
CLIP APPLIE ROT 10 11.4 M/L (STAPLE) IMPLANT
COVER MAYO STAND STRL (DRAPES) ×4 IMPLANT
COVER SURGICAL LIGHT HANDLE (MISCELLANEOUS) ×4 IMPLANT
DRAPE PROXIMA HALF (DRAPES) ×2 IMPLANT
DRAPE UTILITY XL STRL (DRAPES) ×6 IMPLANT
DRAPE WARM FLUID 44X44 (DRAPE) ×2 IMPLANT
DRSG OPSITE POSTOP 4X10 (GAUZE/BANDAGES/DRESSINGS) IMPLANT
DRSG OPSITE POSTOP 4X8 (GAUZE/BANDAGES/DRESSINGS) ×1 IMPLANT
DRSG TEGADERM 2-3/8X2-3/4 SM (GAUZE/BANDAGES/DRESSINGS) ×3 IMPLANT
ELECT BLADE 6.5 EXT (BLADE) ×2 IMPLANT
ELECT CAUTERY BLADE 6.4 (BLADE) ×4 IMPLANT
ELECT REM PT RETURN 9FT ADLT (ELECTROSURGICAL) ×2
ELECTRODE REM PT RTRN 9FT ADLT (ELECTROSURGICAL) ×1 IMPLANT
GAUZE SPONGE 2X2 8PLY STRL LF (GAUZE/BANDAGES/DRESSINGS) IMPLANT
GEL ULTRASOUND 20GR AQUASONIC (MISCELLANEOUS) IMPLANT
GLOVE BIO SURGEON STRL SZ7 (GLOVE) ×5 IMPLANT
GLOVE BIO SURGEON STRL SZ7.5 (GLOVE) ×4 IMPLANT
GLOVE BIOGEL PI IND STRL 7.0 (GLOVE) IMPLANT
GLOVE BIOGEL PI IND STRL 7.5 (GLOVE) IMPLANT
GLOVE BIOGEL PI IND STRL 8.5 (GLOVE) IMPLANT
GLOVE BIOGEL PI INDICATOR 7.0 (GLOVE) ×4
GLOVE BIOGEL PI INDICATOR 7.5 (GLOVE) ×2
GLOVE BIOGEL PI INDICATOR 8.5 (GLOVE) ×1
GLOVE BIOGEL PI ORTHO PRO SZ8 (GLOVE) ×1
GLOVE ECLIPSE 7.5 STRL STRAW (GLOVE) ×1 IMPLANT
GLOVE PI ORTHO PRO STRL SZ8 (GLOVE) IMPLANT
GLOVE SURG SS PI 6.5 STRL IVOR (GLOVE) ×3 IMPLANT
GOWN STRL REUS W/ TWL LRG LVL3 (GOWN DISPOSABLE) ×8 IMPLANT
GOWN STRL REUS W/TWL LRG LVL3 (GOWN DISPOSABLE) ×10
GOWN STRL REUS W/TWL XL LVL3 (GOWN DISPOSABLE) ×1 IMPLANT
KIT BASIN OR (CUSTOM PROCEDURE TRAY) ×2 IMPLANT
KIT ROOM TURNOVER OR (KITS) ×2 IMPLANT
LEGGING LITHOTOMY PAIR STRL (DRAPES) ×2 IMPLANT
LIGASURE IMPACT 36 18CM CVD LR (INSTRUMENTS) IMPLANT
NS IRRIG 1000ML POUR BTL (IV SOLUTION) ×4 IMPLANT
PAD ARMBOARD 7.5X6 YLW CONV (MISCELLANEOUS) ×4 IMPLANT
PENCIL BUTTON HOLSTER BLD 10FT (ELECTRODE) ×4 IMPLANT
RETRACTOR WND ALEXIS 18 MED (MISCELLANEOUS) IMPLANT
RTRCTR WOUND ALEXIS 18CM MED (MISCELLANEOUS)
SCALPEL HARMONIC ACE (MISCELLANEOUS) ×2 IMPLANT
SCISSORS LAP 5X35 DISP (ENDOMECHANICALS) ×2 IMPLANT
SET IRRIG TUBING LAPAROSCOPIC (IRRIGATION / IRRIGATOR) ×1 IMPLANT
SLEEVE ENDOPATH XCEL 5M (ENDOMECHANICALS) ×4 IMPLANT
SPECIMEN JAR LARGE (MISCELLANEOUS) ×2 IMPLANT
SPONGE GAUZE 2X2 STER 10/PKG (GAUZE/BANDAGES/DRESSINGS) ×1
STAPLER CIRC CVD 29MM 37CM (STAPLE) ×1 IMPLANT
STAPLER CUT CVD 40MM GREEN (STAPLE) ×1 IMPLANT
STAPLER PROXIMATE 75MM BLUE (STAPLE) ×1 IMPLANT
STAPLER VISISTAT 35W (STAPLE) ×2 IMPLANT
SURGILUBE 2OZ TUBE FLIPTOP (MISCELLANEOUS) ×2 IMPLANT
SUT PDS AB 1 CT  36 (SUTURE) ×2
SUT PDS AB 1 CT 36 (SUTURE) ×2 IMPLANT
SUT PDS AB 1 TP1 96 (SUTURE) ×2 IMPLANT
SUT PROLENE 2 0 CT2 30 (SUTURE) ×1 IMPLANT
SUT PROLENE 2 0 KS (SUTURE) IMPLANT
SUT PROLENE 2 0 SH 30 (SUTURE) ×1 IMPLANT
SUT SILK 2 0 (SUTURE) ×2
SUT SILK 2 0 SH CR/8 (SUTURE) ×2 IMPLANT
SUT SILK 2-0 18XBRD TIE 12 (SUTURE) ×1 IMPLANT
SUT SILK 3 0 (SUTURE) ×2
SUT SILK 3 0 SH CR/8 (SUTURE) ×2 IMPLANT
SUT SILK 3-0 18XBRD TIE 12 (SUTURE) ×1 IMPLANT
SYR BULB IRRIGATION 50ML (SYRINGE) ×2 IMPLANT
SYS LAPSCP GELPORT 120MM (MISCELLANEOUS) ×2
SYSTEM LAPSCP GELPORT 120MM (MISCELLANEOUS) IMPLANT
TOWEL OR 17X26 10 PK STRL BLUE (TOWEL DISPOSABLE) ×3 IMPLANT
TRAY FOLEY CATH 16FRSI W/METER (SET/KITS/TRAYS/PACK) ×2 IMPLANT
TRAY FOLEY W/METER SILVER 16FR (SET/KITS/TRAYS/PACK) ×1 IMPLANT
TRAY LAPAROSCOPIC MC (CUSTOM PROCEDURE TRAY) ×2 IMPLANT
TRAY PROCTOSCOPIC FIBER OPTIC (SET/KITS/TRAYS/PACK) ×2 IMPLANT
TROCAR XCEL 12X100 BLDLESS (ENDOMECHANICALS) IMPLANT
TROCAR XCEL BLUNT TIP 100MML (ENDOMECHANICALS) IMPLANT
TROCAR XCEL NON-BLD 11X100MML (ENDOMECHANICALS) IMPLANT
TROCAR XCEL NON-BLD 5MMX100MML (ENDOMECHANICALS) ×2 IMPLANT
TUBE CONNECTING 12X1/4 (SUCTIONS) ×4 IMPLANT
TUBING INSUFFLATION (TUBING) ×2 IMPLANT
YANKAUER SUCT BULB TIP NO VENT (SUCTIONS) ×4 IMPLANT

## 2016-04-18 NOTE — Transfer of Care (Signed)
Immediate Anesthesia Transfer of Care Note  Patient: Keith Beckmann, MD  Procedure(s) Performed: Procedure(s): LAPAROSCOPIC ASSISTED  SIGMOID COLECTOMY (N/A)  Patient Location: PACU  Anesthesia Type:General  Level of Consciousness: sedated  Airway & Oxygen Therapy: Patient Spontanous Breathing and Patient connected to nasal cannula oxygen  Post-op Assessment: Report given to RN and Post -op Vital signs reviewed and stable  Post vital signs: stable  Last Vitals:  Filed Vitals:   04/18/16 1037 04/18/16 1519  BP: 138/71   Pulse: 59   Temp: 37 C 36.7 C  Resp: 18     Last Pain: There were no vitals filed for this visit.       Complications: No apparent anesthesia complications

## 2016-04-18 NOTE — Interval H&P Note (Signed)
History and Physical Interval Note:  04/18/2016 8:48 AM  Keith Beckmann, MD  has presented today for surgery, with the diagnosis of sigmoid diverticulitis  The various methods of treatment have been discussed with the patient and family. After consideration of risks, benefits and other options for treatment, the patient has consented to  Procedure(s): LAPAROSCOPIC ASSISTED  SIGMOID COLECTOMY (N/A) as a surgical intervention .  The patient's history has been reviewed, patient examined, no change in status, stable for surgery.  I have reviewed the patient's chart and labs.  Questions were answered to the patient's satisfaction.     TOTH III,PAUL S

## 2016-04-18 NOTE — Anesthesia Postprocedure Evaluation (Signed)
Anesthesia Post Note  Patient: Keith Beckmann, MD  Procedure(s) Performed: Procedure(s) (LRB): LAPAROSCOPIC ASSISTED  SIGMOID COLECTOMY (N/A)  Patient location during evaluation: PACU Anesthesia Type: General Level of consciousness: awake and alert Pain management: pain level controlled Vital Signs Assessment: post-procedure vital signs reviewed and stable Respiratory status: spontaneous breathing, nonlabored ventilation, respiratory function stable and patient connected to nasal cannula oxygen Cardiovascular status: blood pressure returned to baseline and stable Postop Assessment: no signs of nausea or vomiting Anesthetic complications: no    Last Vitals:  Filed Vitals:   04/18/16 1530 04/18/16 1545  BP:    Pulse: 68 70  Temp:    Resp: 17 21    Last Pain:  Filed Vitals:   04/18/16 1627  PainSc: 4                  [name]

## 2016-04-18 NOTE — H&P (Signed)
Russ Halo. Shober  Location: [facility] Patient #: [id number] DOB: December 17, 1945 Divorced / Language: English / Race: White Male   History of Present Illness The patient is a 70 year old male who presents for a follow-up for Abdominal pain. The patient is a 15-year-old white male who I have been following for sigmoid colon diverticulitis. He has had a recent recurrence of his pain. He was switched from Augmentin to Cipro and Flagyl and had significant improvement in his pain. A repeat CT scan showed a reaccumulation of a small abscess associated with the wall of the sigmoid colon. This was too small to drain. He returns today for surgical planning. 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   Allergies  Iohexol *DIAGNOSTIC PRODUCTS*  Medication History AmLODIPine Besylate (10MG  Tablet, Oral) Active. BuPROPion HCl ER (XL) (150MG  Tablet ER 24HR, Oral) Active. Metoprolol Succinate ER (50MG  Tablet ER 24HR, Oral) Active. RaNITidine HCl (150MG  Tablet, Oral) Active. Ciprofloxacin HCl (750MG  Tablet, Oral two times daily) Active. MetroNIDAZOLE (500MG  Tablet, Oral three times daily) Active. Rosuvastatin Calcium (10MG  Tablet, Oral) Active. Folic Acid (0000000 Tablet, Oral) Active. Vitamin C (500MG  Capsule, Oral) Active. Vitamin D3 (5000UNIT Capsule, Oral) Active. Medications Reconciled    Review of Systems  General Not Present- Appetite Loss, Chills, Fatigue, Fever, Night Sweats, Weight Gain and Weight Loss. Skin Not Present- Change in Wart/Mole, Dryness, Hives, Jaundice, New Lesions, Non-Healing Wounds, Rash and Ulcer. HEENT Present- Hearing Loss, Ringing in the Ears and Wears glasses/contact lenses. Not Present- Earache, Hoarseness, Nose Bleed, Oral Ulcers, Seasonal Allergies, Sinus Pain, Sore Throat, Visual Disturbances and Yellow Eyes. Respiratory Not Present- Bloody sputum, Chronic Cough, Difficulty Breathing, Snoring and Wheezing. Breast Not Present-  Breast Mass, Breast Pain, Nipple Discharge and Skin Changes. Cardiovascular Not Present- Chest Pain, Difficulty Breathing Lying Down, Leg Cramps, Palpitations, Rapid Heart Rate, Shortness of Breath and Swelling of Extremities. Gastrointestinal Not Present- Abdominal Pain, Bloating, Bloody Stool, Change in Bowel Habits, Chronic diarrhea, Constipation, Difficulty Swallowing, Excessive gas, Gets full quickly at meals, Hemorrhoids, Indigestion, Nausea, Rectal Pain and Vomiting. Male Genitourinary Present- Change in Urinary Stream. Not Present- Blood in Urine, Frequency, Impotence, Nocturia, Painful Urination, Urgency and Urine Leakage. Musculoskeletal Not Present- Back Pain, Joint Pain, Joint Stiffness, Muscle Pain, Muscle Weakness and Swelling of Extremities. Neurological Not Present- Decreased Memory, Fainting, Headaches, Numbness, Seizures, Tingling, Tremor, Trouble walking and Weakness. Psychiatric Not Present- Anxiety, Bipolar, Change in Sleep Pattern, Depression, Fearful and Frequent crying. Endocrine Not Present- Cold Intolerance, Excessive Hunger, Hair Changes, Heat Intolerance and New Diabetes. Hematology Not Present- Easy Bruising, Excessive bleeding, Gland problems, HIV and Persistent Infections.  Vitals Weight: 194 lb Height: 69in Body Surface Area: 2.04 m Body Mass Index: 28.65 kg/m  Temp.: 97.67F  Pulse: 64 (Regular)  BP: 140/82 (Sitting, Left Arm, Standard)       Physical Exam  General Mental Status-Alert. General Appearance-Consistent with stated age. Hydration-Well hydrated. Voice-Normal.  Head and Neck Head-normocephalic, atraumatic with no lesions or palpable masses. Trachea-midline. Thyroid Gland Characteristics - normal size and consistency.  Eye Eyeball - Bilateral-Extraocular movements intact. Sclera/Conjunctiva - Bilateral-No scleral icterus.  Chest and Lung Exam Chest and lung exam reveals -quiet, even and easy respiratory  effort with no use of accessory muscles and on auscultation, normal breath sounds, no adventitious sounds and normal vocal resonance. Inspection Chest Wall - Normal. Back - normal.  Cardiovascular Cardiovascular examination reveals -normal heart sounds, regular rate and rhythm with no murmurs and normal pedal pulses bilaterally.  Abdomen Note:  The abdomen is soft. There is minimal suprapubic tenderness now. There is no evidence of peritonitis. There is no palpable mass.   Neurologic Neurologic evaluation reveals -alert and oriented x 3 with no impairment of recent or remote memory. Mental Status-Normal.  Musculoskeletal Normal Exam - Left-Upper Extremity Strength Normal and Lower Extremity Strength Normal. Normal Exam - Right-Upper Extremity Strength Normal and Lower Extremity Strength Normal.  Lymphatic Head & Neck  General Head & Neck Lymphatics: Bilateral - Description - Normal. Axillary  General Axillary Region: Bilateral - Description - Normal. Tenderness - Non Tender. Femoral & Inguinal  Generalized Femoral & Inguinal Lymphatics: Bilateral - Description - Normal. Tenderness - Non Tender.    Assessment & Plan  DIVERTICULITIS, COLON (K57.32) Impression: The patient Has been experiencing recurring episodes of diverticulitis of the sigmoid colon with a small abscess. He has improved much more on Cipro and Flagyl recently. Because of the recurring nature of the infection I suspect that he would benefit from an elective resection of his sigmoid colon. He is in agreement with this. I have discussed with him in detail the risks and benefits of the operation as well as some of the technical aspects including the risk of leak and the need for an ostomy and he understands and wishes to proceed. I will plan for a laparoscopic assisted sigmoid colectomy.    Signed by Luella Cook, MD

## 2016-04-18 NOTE — Anesthesia Preprocedure Evaluation (Addendum)
Anesthesia Evaluation  Patient identified by MRN, date of birth, ID band Patient awake    Reviewed: Allergy & Precautions, NPO status , Patient's Chart, lab work & pertinent test results  History of Anesthesia Complications (+) DIFFICULT AIRWAY and history of anesthetic complications  Airway Mallampati: II  TM Distance: >3 FB Neck ROM: Limited   Comment: Significantly restricted neck extension Dental no notable dental hx. (+) Dental Advisory Given   Pulmonary neg pulmonary ROS, asthma ,    Pulmonary exam normal breath sounds clear to auscultation       Cardiovascular Exercise Tolerance: Good hypertension, Pt. on medications and Pt. on home beta blockers Normal cardiovascular exam+ dysrhythmias  Rhythm:Regular Rate:Normal  EF preserved   Neuro/Psych PSYCHIATRIC DISORDERS Depression negative neurological ROS     GI/Hepatic Neg liver ROS, GERD  Medicated,  Endo/Other  negative endocrine ROS  Renal/GU negative Renal ROS  negative genitourinary   Musculoskeletal negative musculoskeletal ROS (+)   Abdominal   Peds negative pediatric ROS (+)  Hematology negative hematology ROS (+)   Anesthesia Other Findings   Reproductive/Obstetrics negative OB ROS                           Anesthesia Physical  Anesthesia Plan  ASA: III  Anesthesia Plan: General   Post-op Pain Management:    Induction: Intravenous  Airway Management Planned: Oral ETT and Video Laryngoscope Planned  Additional Equipment:   Intra-op Plan:   Post-operative Plan: Extubation in OR  Informed Consent: I have reviewed the patients History and Physical, chart, labs and discussed the procedure including the risks, benefits and alternatives for the proposed anesthesia with the patient or authorized representative who has indicated his/her understanding and acceptance.   Dental advisory given  Plan Discussed with:  CRNA  Anesthesia Plan Comments:         Anesthesia Quick Evaluation

## 2016-04-18 NOTE — Anesthesia Procedure Notes (Signed)
Procedure Name: Intubation Date/Time: 04/18/2016 2:06 PM Performed by: Freddie Breech Pre-anesthesia Checklist: Patient identified, Emergency Drugs available, Suction available, Patient being monitored and Timeout performed Patient Re-evaluated:Patient Re-evaluated prior to inductionOxygen Delivery Method: Circle system utilized Preoxygenation: Pre-oxygenation with 100% oxygen Intubation Type: IV induction and Rapid sequence Laryngoscope Size: Glidescope and 4 Grade View: Grade I Tube type: Oral Number of attempts: 1 Airway Equipment and Method: Patient positioned with wedge pillow,  Stylet and Video-laryngoscopy Placement Confirmation: positive ETCO2,  CO2 detector and breath sounds checked- equal and bilateral (Vido confirmation via glidescope) Secured at: 23 cm Tube secured with: Tape Dental Injury: Teeth and Oropharynx as per pre-operative assessment  Difficulty Due To: Difficulty was anticipated and Difficult Airway- due to reduced neck mobility

## 2016-04-18 NOTE — Op Note (Addendum)
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 Georgette Dover   ANESTHESIA:   general  EBL:  Total I/O In: 2000 [I.V.:2000] Out: 325 [Urine:250; Blood:75]  BLOOD ADMINISTERED:none  DRAINS: none   LOCAL MEDICATIONS USED:  MARCAINE     SPECIMEN:  Source of Specimen:  sigmoid colon  DISPOSITION OF SPECIMEN:  PATHOLOGY  COUNTS:  YES  TOURNIQUET:  * No tourniquets in log *  DICTATION: .Dragon Dictation   After informed consent was obtained patient was brought to the operating room  And placed in the supine position on the operating room table. After adequate induction of general anesthesia the patient was moved into lithotomy position and all pressure points were padded. The abdomen and perineum were then prepped with ChloraPrep and Betadine, allowed to dry, and draped in usual sterile manner.An appropriate timeout was performed. A site was chosen to access the abdominal cavity on the right mid abdomen. This area was infiltrated with quarter percent Marcaine. A small stab incision was made with 15 blade knife. A 5 mm Optiview port and camera were used to bluntly dissect through the layers of the abdominal wall under direct vision until access was gained to the abdominal cavity. The abdomen was then insufflated with carbon dioxide without difficulty. The camera was inserted through the port and the abdomen was generally inspected. The sigmoid colon was identified. Noother obvious abnormalities were identified. Another 5 mm port was placed under direct vision in the right lower quadrant. A 5 mm port was placed in the left  upper quadrant under direct vision. A 5 mm port was then placed below the umbilicus along the midline uunder direct vision.  Next the sigmoid and left colon were mobilized by incising the retroperitoneal attachments along the white line of Toldt. The sigmoid colon had a redundant fold and this appeared to be the site of the inflammation and thickening of the colon. Once the colon seemed adequately mobile a lower midline incision was made below the umbilicus with a 10 blade knife. The incision was carried to the skin and subcutaneous tissue sharply with the electrocautery. The linea alba was identified and also incised  With the electrocautery. The peritoneum was then opened on top of the 5 mm port until the abdominal cavity was open and the rest of the incision was opened under direct vision. A GelPort wound protector was then placed through the abdominal wound. The sigmoid colon was identified. There was a discrete area of thickening and chronic inflammation. A site above this area of colon was chosen. The mesentery at this point was opened sharply with the electrocautery. A GIA-75 stapler was placed across the colon at this point, clamped, and fired thereby dividing the colon between staple lines.  The mesentery to the diseased portion of sigmoid colon was taken down sharply with the Harmonic scalpel. The main inferior mesenteric vessels were clamped with a Kelly clamp, divided, and ligated with 2-0 silk suture ligature. The dissection was carried down to the upper rectum where the rectum felt normal. A green load contour stapler was then placed across the rectum at this point clamped and  fired thereby dividing the rectum between staple lines. The diseased portion of rectosigmoid colon was then removed and sent to pathology for further evaluation. The left colon was very mobile and easily reached down to the stump of the rectum. Next the staple line was removed sharply with electrocautery. The colon at this point  still felt  A little chronically thickened so I chose to remove  A few extra centimeters of left colon.  Once this was accomplished the colon looked very healthy. The colon was sized and I chose a 29 EEA stapler. A 2-0 Prolene pursestring stitch was placed around the edge  Of the proximal colon. The anvil was then placed inside the lumen of the colon and the pursestring stitch was cinched down and tied. Next the assisting surgeon was able to bring the other end of the EEA stapler through the rectum and up to the staple line without difficulty. The spike was deployed anterior to the staple line. The anvil was then placed on the spike and the stapling device was closed into the green range. The stapling device was then clamped and fired thereby creating a nice widely patent connection between the left colon and rectum. 3 2-0 silk  Lembert stitches were placed across the anterior surface of the anastomosis. The anastomosis appeared to have very good blood supply and was under no tension. A soft bowel clamp was placed above the anastomosis. A rigid sigmoidoscopy was performed and the rectosigmoid was insufflated. There was good distention of the colon and anastomosis. This was done with saline in the pelvis and there were no evidence of bubbles. The wound was examined and found to be hemostatic. At this point the  Gas from the colon was evacuated and the sigmoidoscopy was stopped. The abdomen was irrigated with copious amounts of saline. Gowns and gloves were changed and new drapes were applied.The abdominal cavity was again irrigated with copious amounts of saline. Next the fascia of the anterior abdominal wall was closed with 2 running #1 double-stranded looped PDS sutures. The subcutaneous tissue was irrigated with copious amounts of saline. The skin incisions were closed with staples. Sterile dressings were applied. The patient tolerated the procedure well. At the end of the case all needle sponge and instrument  counts were correct. The patient was then awakened and taken to recovery in stable condition.  PLAN OF CARE: Admit to inpatient   PATIENT DISPOSITION:  PACU - hemodynamically stable.   Delay start of Pharmacological VTE agent (>24hrs) due to surgical blood loss or risk of bleeding: no

## 2016-04-19 ENCOUNTER — Encounter (HOSPITAL_COMMUNITY): Payer: Self-pay | Admitting: General Surgery

## 2016-04-19 LAB — CBC
HEMATOCRIT: 41.3 % (ref 39.0–52.0)
HEMOGLOBIN: 14.4 g/dL (ref 13.0–17.0)
MCH: 29.9 pg (ref 26.0–34.0)
MCHC: 34.9 g/dL (ref 30.0–36.0)
MCV: 85.9 fL (ref 78.0–100.0)
Platelets: 276 10*3/uL (ref 150–400)
RBC: 4.81 MIL/uL (ref 4.22–5.81)
RDW: 12.3 % (ref 11.5–15.5)
WBC: 12.3 10*3/uL — AB (ref 4.0–10.5)

## 2016-04-19 LAB — BASIC METABOLIC PANEL
Anion gap: 3 — ABNORMAL LOW (ref 5–15)
BUN: 10 mg/dL (ref 6–20)
CHLORIDE: 107 mmol/L (ref 101–111)
CO2: 25 mmol/L (ref 22–32)
Calcium: 8.9 mg/dL (ref 8.9–10.3)
Creatinine, Ser: 0.88 mg/dL (ref 0.61–1.24)
GFR calc Af Amer: >60 mL/min (ref >60)
Glucose, Bld: 176 mg/dL — ABNORMAL HIGH (ref 65–99)
Potassium: 5 mmol/L (ref 3.5–5.1)
Sodium: 135 mmol/L (ref 135–145)

## 2016-04-19 MED ORDER — ERYTHROMYCIN 5 MG/GM OP OINT
TOPICAL_OINTMENT | Freq: Three times a day (TID) | OPHTHALMIC | Status: DC
Start: 2016-04-19 — End: 2016-04-24
  Administered 2016-04-19: 1 via OPHTHALMIC
  Administered 2016-04-19 – 2016-04-23 (×13): via OPHTHALMIC
  Administered 2016-04-24: 1 via OPHTHALMIC
  Filled 2016-04-19: qty 3.5

## 2016-04-19 NOTE — Progress Notes (Signed)
1 Day Post-Op  Subjective: Complains of soreness but otherwise seems well  Objective: Vital signs in last 24 hours: Temp:  [97.6 F (36.4 C)-98.6 F (37 C)] 98.2 F (36.8 C) (06/09 0451) Pulse Rate:  [59-73] 72 (06/09 0451) Resp:  [10-23] 15 (06/09 0501) BP: (87-138)/(51-71) 124/61 mmHg (06/09 0451) SpO2:  [96 %-100 %] 98 % (06/09 0501) Weight:  [88.451 kg (195 lb)] 88.451 kg (195 lb) (06/08 1037) Last BM Date: 04/18/16  Intake/Output from previous day: 06/08 0701 - 06/09 0700 In: 2000 [I.V.:2000] Out: 2775 [Urine:2700; Blood:75] Intake/Output this shift:    Resp: clear to auscultation bilaterally Cardio: regular rate and rhythm GI: soft, mild tenderness near incision. incision looks good. few bs  Lab Results:   Recent Labs  04/16/16 1021 04/19/16 0305  WBC 7.1 12.3*  HGB 15.9 14.4  HCT 46.1 41.3  PLT 290 276   BMET  Recent Labs  04/16/16 1021 04/19/16 0305  NA 140 135  K 4.6 5.0  CL 106 107  CO2 26 25  GLUCOSE 99 176*  BUN 8 10  CREATININE 1.11 0.88  CALCIUM 9.8 8.9   PT/INR No results for input(s): LABPROT, INR in the last 72 hours. ABG No results for input(s): PHART, HCO3 in the last 72 hours.  Invalid input(s): PCO2, PO2  Studies/Results: No results found.  Anti-infectives: Anti-infectives    Start     Dose/Rate Route Frequency Ordered Stop   04/18/16 1200  ciprofloxacin (CIPRO) IVPB 400 mg  Status:  Discontinued     400 mg 200 mL/hr over 60 Minutes Intravenous On call to O.R. 04/18/16 1033 04/18/16 1704   04/18/16 1200  metroNIDAZOLE (FLAGYL) IVPB 500 mg     500 mg 100 mL/hr over 60 Minutes Intravenous On call to O.R. 04/18/16 1033 04/18/16 1357   04/18/16 1032  ciprofloxacin (CIPRO) 400 MG/200ML IVPB    Comments:  Laurita Quint   : cabinet override      04/18/16 1032 04/18/16 1323      Assessment/Plan: s/p Procedure(s): LAPAROSCOPIC ASSISTED  SIGMOID COLECTOMY (N/A) Advance diet. Allow ice chips today OOB to chair Leave foley  in until tomorrow IV metoprolol for HTN  LOS: 1 day    TOTH III,Lindsie Simar S 04/19/2016

## 2016-04-19 NOTE — Progress Notes (Signed)
Pt ambulated about a 173meters on unit this pm. Pain well controlled on pca pump. Foley catheter remains in place, to be removed tomorrow

## 2016-04-19 NOTE — Anesthesia Postprocedure Evaluation (Signed)
Anesthesia Post Note  Patient: Keith Beckmann, MD  Procedure(s) Performed: Procedure(s) (LRB): LAPAROSCOPIC ASSISTED  SIGMOID COLECTOMY (N/A)  Patient location during evaluation: PACU Anesthesia Type: General Level of consciousness: awake Pain management: pain level controlled Vital Signs Assessment: post-procedure vital signs reviewed and stable Respiratory status: spontaneous breathing Cardiovascular status: stable Anesthetic complications: no    Last Vitals:  Filed Vitals:   04/19/16 1650 04/19/16 1816  BP:  134/68  Pulse:  64  Temp:  37.2 C  Resp: 14 16    Last Pain:  Filed Vitals:   04/19/16 1817  PainSc: 3                  EDWARDS,Syrus Nakama

## 2016-04-20 LAB — CBC WITH DIFFERENTIAL/PLATELET
Basophils Absolute: 0 10*3/uL (ref 0.0–0.1)
Basophils Relative: 0 %
EOS PCT: 0 %
Eosinophils Absolute: 0 10*3/uL (ref 0.0–0.7)
HCT: 42.6 % (ref 39.0–52.0)
Hemoglobin: 14.6 g/dL (ref 13.0–17.0)
LYMPHS ABS: 1.8 10*3/uL (ref 0.7–4.0)
LYMPHS PCT: 17 %
MCH: 30 pg (ref 26.0–34.0)
MCHC: 34.3 g/dL (ref 30.0–36.0)
MCV: 87.7 fL (ref 78.0–100.0)
MONO ABS: 1.1 10*3/uL — AB (ref 0.1–1.0)
Monocytes Relative: 10 %
Neutro Abs: 7.8 10*3/uL — ABNORMAL HIGH (ref 1.7–7.7)
Neutrophils Relative %: 73 %
PLATELETS: 269 10*3/uL (ref 150–400)
RBC: 4.86 MIL/uL (ref 4.22–5.81)
RDW: 12.8 % (ref 11.5–15.5)
WBC: 10.7 10*3/uL — ABNORMAL HIGH (ref 4.0–10.5)

## 2016-04-20 LAB — BASIC METABOLIC PANEL
ANION GAP: 5 (ref 5–15)
BUN: 10 mg/dL (ref 6–20)
CALCIUM: 8.8 mg/dL — AB (ref 8.9–10.3)
CO2: 28 mmol/L (ref 22–32)
CREATININE: 0.92 mg/dL (ref 0.61–1.24)
Chloride: 105 mmol/L (ref 101–111)
Glucose, Bld: 116 mg/dL — ABNORMAL HIGH (ref 65–99)
Potassium: 4.3 mmol/L (ref 3.5–5.1)
SODIUM: 138 mmol/L (ref 135–145)

## 2016-04-20 MED ORDER — BUPROPION HCL ER (XL) 150 MG PO TB24
450.0000 mg | ORAL_TABLET | Freq: Every day | ORAL | Status: DC
  Administered 2016-04-20 – 2016-04-23 (×4): 450 mg via ORAL
  Filled 2016-04-20 (×4): qty 3

## 2016-04-20 MED ORDER — SILODOSIN 8 MG PO CAPS: 8.0000 mg | ORAL_CAPSULE | Freq: Every day | ORAL | Status: DC

## 2016-04-20 MED ORDER — TAMSULOSIN HCL 0.4 MG PO CAPS
0.4000 mg | ORAL_CAPSULE | Freq: Every day | ORAL | Status: DC
  Administered 2016-04-20: 0.4 mg via ORAL
  Filled 2016-04-20: qty 1

## 2016-04-20 MED ORDER — SILODOSIN 8 MG PO CAPS
8.0000 mg | ORAL_CAPSULE | Freq: Every day | ORAL | Status: DC
  Administered 2016-04-20 – 2016-04-23 (×4): 8 mg via ORAL
  Filled 2016-04-20 (×4): qty 1

## 2016-04-20 MED ORDER — NON FORMULARY: 8.0000 mg | Freq: Every day | Status: DC

## 2016-04-20 MED ORDER — LIDOCAINE HCL 2 % EX GEL
1.0000 "application " | Freq: Once | CUTANEOUS | Status: AC
  Administered 2016-04-20: 1 via URETHRAL
  Filled 2016-04-20 (×3): qty 5
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
## 2016-04-20 NOTE — Progress Notes (Signed)
Pt has voided small amounts urine since his foley was DC early this am but starting to have more difficulty with it now.  He is requesting flomax Romilda Joy Alinda Money is his urologist) which is marked as allergy but just gives him HA, not true allergy.PA texted.

## 2016-04-20 NOTE — Progress Notes (Signed)
2 Days Post-Op  Subjective: Complains of a little more pain and distension today than yesterday but overall still feels ok  Objective: Vital signs in last 24 hours: Temp:  [98.4 F (36.9 C)-99 F (37.2 C)] 98.6 F (37 C) (06/09 2047) Pulse Rate:  [59-81] 81 (06/09 2047) Resp:  [14-19] 17 (06/10 0000) BP: (133-144)/(54-69) 133/69 mmHg (06/09 2047) SpO2:  [94 %-100 %] 100 % (06/10 0000) Last BM Date: 04/18/16  Intake/Output from previous day: 06/09 0701 - 06/10 0700 In: 50 [P.O.:50] Out: 1950 [Urine:1950] Intake/Output this shift:    Resp: clear to auscultation bilaterally Cardio: regular rate and rhythm GI: soft, moderate tenderness near incision. incision looks good. good bs  Lab Results:   Recent Labs  04/19/16 0305  WBC 12.3*  HGB 14.4  HCT 41.3  PLT 276   BMET  Recent Labs  04/19/16 0305  NA 135  K 5.0  CL 107  CO2 25  GLUCOSE 176*  BUN 10  CREATININE 0.88  CALCIUM 8.9   PT/INR No results for input(s): LABPROT, INR in the last 72 hours. ABG No results for input(s): PHART, HCO3 in the last 72 hours.  Invalid input(s): PCO2, PO2  Studies/Results: No results found.  Anti-infectives: Anti-infectives    Start     Dose/Rate Route Frequency Ordered Stop   04/18/16 1200  ciprofloxacin (CIPRO) IVPB 400 mg  Status:  Discontinued     400 mg 200 mL/hr over 60 Minutes Intravenous On call to O.R. 04/18/16 1033 04/18/16 1704   04/18/16 1200  metroNIDAZOLE (FLAGYL) IVPB 500 mg     500 mg 100 mL/hr over 60 Minutes Intravenous On call to O.R. 04/18/16 1033 04/18/16 1357   04/18/16 1032  ciprofloxacin (CIPRO) 400 MG/200ML IVPB    Comments:  Laurita Quint   : cabinet override      04/18/16 1032 04/18/16 1323      Assessment/Plan: s/p Procedure(s): LAPAROSCOPIC ASSISTED  SIGMOID COLECTOMY (N/A) Continue clears  Continue entereg Emycin ointment for corneal abrasion Check wbc and lytes today D/c foley Ambulate Continue pca for pain control IV  metoprolol for htn(controlled)  LOS: 2 days    TOTH III,Shantel Wesely S 04/20/2016

## 2016-04-20 NOTE — Progress Notes (Signed)
Unable to place coude catheter, able to insert but no urine return.

## 2016-04-20 NOTE — Progress Notes (Signed)
Pt just voiding small amounts of urine and is uncomfortable.  Bladder scan=530 cc urine.  Dr. Donne Hazel on the floor, informed and gave orders.

## 2016-04-21 MED ORDER — ACETAMINOPHEN 325 MG PO TABS
650.0000 mg | ORAL_TABLET | Freq: Four times a day (QID) | ORAL | Status: DC | PRN
  Administered 2016-04-21: 650 mg via ORAL
  Filled 2016-04-21: qty 2

## 2016-04-21 MED ORDER — HYDROCODONE-ACETAMINOPHEN 5-325 MG PO TABS: 1.0000 | ORAL_TABLET | ORAL | Status: DC | PRN

## 2016-04-21 NOTE — Plan of Care (Signed)
Problem: Pain Management: Goal: General experience of comfort will improve PCA d/c'd and pt educated on po pain med.  Pt informed to call me when pain starts so I can medicate him.

## 2016-04-21 NOTE — Progress Notes (Signed)
3 Days Post-Op  Subjective: Complains only of some soreness. Passed a lot of flatus this am. Doesn't feel hungry. Couldn't urinate after foley was removed. New foley had to be placed by urology  Objective: Vital signs in last 24 hours: Temp:  [97.8 F (36.6 C)-98.5 F (36.9 C)] 98.4 F (36.9 C) (06/11 0528) Pulse Rate:  [68-81] 74 (06/11 0528) Resp:  [17-21] 18 (06/11 0528) BP: (127-145)/(66-81) 136/70 mmHg (06/11 0528) SpO2:  [95 %-99 %] 97 % (06/11 0528) Last BM Date: 04/18/16  Intake/Output from previous day: 06/10 0701 - 06/11 0700 In: 1841 [P.O.:320; I.V.:1321; IV Piggyback:200] Out: 2225 [Urine:2225] Intake/Output this shift:    Resp: clear to auscultation bilaterally Cardio: regular rate and rhythm GI: soft, nontender. good bs. incisions look good  Lab Results:   Recent Labs  04/19/16 0305 04/20/16 0500  WBC 12.3* 10.7*  HGB 14.4 14.6  HCT 41.3 42.6  PLT 276 269   BMET  Recent Labs  04/19/16 0305 04/20/16 0500  NA 135 138  K 5.0 4.3  CL 107 105  CO2 25 28  GLUCOSE 176* 116*  BUN 10 10  CREATININE 0.88 0.92  CALCIUM 8.9 8.8*   PT/INR No results for input(s): LABPROT, INR in the last 72 hours. ABG No results for input(s): PHART, HCO3 in the last 72 hours.  Invalid input(s): PCO2, PO2  Studies/Results: No results found.  Anti-infectives: Anti-infectives    Start     Dose/Rate Route Frequency Ordered Stop   04/18/16 1200  ciprofloxacin (CIPRO) IVPB 400 mg  Status:  Discontinued     400 mg 200 mL/hr over 60 Minutes Intravenous On call to O.R. 04/18/16 1033 04/18/16 1704   04/18/16 1200  metroNIDAZOLE (FLAGYL) IVPB 500 mg     500 mg 100 mL/hr over 60 Minutes Intravenous On call to O.R. 04/18/16 1033 04/18/16 1357   04/18/16 1032  ciprofloxacin (CIPRO) 400 MG/200ML IVPB    Comments:  Laurita Quint   : cabinet override      04/18/16 1032 04/18/16 1323      Assessment/Plan: s/p Procedure(s): LAPAROSCOPIC ASSISTED  SIGMOID COLECTOMY  (N/A) Stay with clears for now until he can tolerate more  Continue foley. Appreciate urology help D/c pca ambulate  LOS: 3 days    TOTH III,PAUL S 04/21/2016

## 2016-04-21 NOTE — Progress Notes (Signed)
Offered Pt. A bath and Pt. Stated he would do it later on after he walks and has lunch

## 2016-04-21 NOTE — Progress Notes (Signed)
Pt. Stated he did not wanted anything on the tray except the two [place] Ices

## 2016-04-22 MED ORDER — ROSUVASTATIN CALCIUM 10 MG PO TABS
10.0000 mg | ORAL_TABLET | ORAL | Status: DC
  Administered 2016-04-22: 10 mg via ORAL
  Filled 2016-04-22 (×3): qty 1

## 2016-04-22 MED ORDER — DOCUSATE SODIUM 100 MG PO CAPS
100.0000 mg | ORAL_CAPSULE | Freq: Two times a day (BID) | ORAL | Status: DC
  Administered 2016-04-22 – 2016-04-24 (×5): 100 mg via ORAL
  Filled 2016-04-22 (×5): qty 1

## 2016-04-22 MED ORDER — FAMOTIDINE 20 MG PO TABS
20.0000 mg | ORAL_TABLET | Freq: Two times a day (BID) | ORAL | Status: DC
  Administered 2016-04-22 – 2016-04-24 (×4): 20 mg via ORAL
  Filled 2016-04-22 (×4): qty 1

## 2016-04-22 MED ORDER — METOPROLOL SUCCINATE ER 50 MG PO TB24
50.0000 mg | ORAL_TABLET | Freq: Every day | ORAL | Status: DC
  Administered 2016-04-22 – 2016-04-24 (×3): 50 mg via ORAL
  Filled 2016-04-22 (×3): qty 1

## 2016-04-22 MED ORDER — ASPIRIN EC 81 MG PO TBEC
81.0000 mg | DELAYED_RELEASE_TABLET | Freq: Every day | ORAL | Status: DC
  Administered 2016-04-22 – 2016-04-24 (×3): 81 mg via ORAL
  Filled 2016-04-22 (×3): qty 1

## 2016-04-22 MED ORDER — AMLODIPINE BESYLATE 10 MG PO TABS
10.0000 mg | ORAL_TABLET | Freq: Every day | ORAL | Status: DC
  Administered 2016-04-22 – 2016-04-24 (×3): 10 mg via ORAL
  Filled 2016-04-22 (×3): qty 1

## 2016-04-22 NOTE — Progress Notes (Signed)
4 Days Post-Op  Subjective: Felt some chills and sweats overnight. Denies abdominal pain. Passing lots of flatus  Objective: Vital signs in last 24 hours: Temp:  [98.3 F (36.8 C)-99.7 F (37.6 C)] 98.3 F (36.8 C) (06/12 0602) Pulse Rate:  [69-82] 69 (06/12 0602) Resp:  [18] 18 (06/12 0602) BP: (128-143)/(74-77) 143/75 mmHg (06/12 0602) SpO2:  [96 %-98 %] 96 % (06/12 0602) Last BM Date: 04/18/16  Intake/Output from previous day: 06/11 0701 - 06/12 0700 In: 1075 [P.O.:320; I.V.:655; IV Piggyback:100] Out: [id number] [Urine:4750] Intake/Output this shift:    Resp: clear to auscultation bilaterally Cardio: regular rate and rhythm GI: soft, nontender. good bs. incisions look good  Lab Results:   Recent Labs  04/20/16 0500  WBC 10.7*  HGB 14.6  HCT 42.6  PLT 269   BMET  Recent Labs  04/20/16 0500  NA 138  K 4.3  CL 105  CO2 28  GLUCOSE 116*  BUN 10  CREATININE 0.92  CALCIUM 8.8*   PT/INR No results for input(s): LABPROT, INR in the last 72 hours. ABG No results for input(s): PHART, HCO3 in the last 72 hours.  Invalid input(s): PCO2, PO2  Studies/Results: No results found.  Anti-infectives: Anti-infectives    Start     Dose/Rate Route Frequency Ordered Stop   04/18/16 1200  ciprofloxacin (CIPRO) IVPB 400 mg  Status:  Discontinued     400 mg 200 mL/hr over 60 Minutes Intravenous On call to O.R. 04/18/16 1033 04/18/16 1704   04/18/16 1200  metroNIDAZOLE (FLAGYL) IVPB 500 mg     500 mg 100 mL/hr over 60 Minutes Intravenous On call to O.R. 04/18/16 1033 04/18/16 1357   04/18/16 1032  ciprofloxacin (CIPRO) 400 MG/200ML IVPB    Comments:  Laurita Quint   : cabinet override      04/18/16 1032 04/18/16 1323      Assessment/Plan: s/p Procedure(s): LAPAROSCOPIC ASSISTED  SIGMOID COLECTOMY (N/A) Advance diet  Ambulate Restart home meds  LOS: 4 days    TOTH III,Jeraldean Wechter S 04/22/2016

## 2016-04-22 NOTE — Care Management Important Message (Signed)
Important Message  Patient Details  Name: Keith FETTEROLF, MD MRN: [id number] Date of Birth: 1946-03-06   Medicare Important Message Given:  Yes    Loann Quill 04/22/2016, 1:59 PM

## 2016-04-23 LAB — CBC WITH DIFFERENTIAL/PLATELET
BASOS ABS: 0 10*3/uL (ref 0.0–0.1)
BASOS PCT: 1 %
Eosinophils Absolute: 0.2 10*3/uL (ref 0.0–0.7)
Eosinophils Relative: 3 %
HEMATOCRIT: 42 % (ref 39.0–52.0)
HEMOGLOBIN: 14.3 g/dL (ref 13.0–17.0)
Lymphocytes Relative: 21 %
Lymphs Abs: 1.7 10*3/uL (ref 0.7–4.0)
MCH: 29.7 pg (ref 26.0–34.0)
MCHC: 34 g/dL (ref 30.0–36.0)
MCV: 87.1 fL (ref 78.0–100.0)
MONOS PCT: 13 %
Monocytes Absolute: 1 10*3/uL (ref 0.1–1.0)
NEUTROS ABS: 5 10*3/uL (ref 1.7–7.7)
NEUTROS PCT: 62 %
Platelets: 277 10*3/uL (ref 150–400)
RBC: 4.82 MIL/uL (ref 4.22–5.81)
RDW: 12.5 % (ref 11.5–15.5)
WBC: 7.9 10*3/uL (ref 4.0–10.5)

## 2016-04-23 LAB — BASIC METABOLIC PANEL
ANION GAP: 6 (ref 5–15)
BUN: 7 mg/dL (ref 6–20)
CHLORIDE: 104 mmol/L (ref 101–111)
CO2: 29 mmol/L (ref 22–32)
Calcium: 9.5 mg/dL (ref 8.9–10.3)
Creatinine, Ser: 1 mg/dL (ref 0.61–1.24)
Glucose, Bld: 109 mg/dL — ABNORMAL HIGH (ref 65–99)
Potassium: 4.1 mmol/L (ref 3.5–5.1)
Sodium: 139 mmol/L (ref 135–145)

## 2016-04-23 NOTE — Progress Notes (Signed)
Nutrition Brief Note  Patient identified on the Malnutrition Screening Tool (MST) Report  Wt Readings from Last 15 Encounters:  04/18/16 195 lb (88.451 kg)  04/16/16 195 lb (88.451 kg)  12/07/15 185 lb (83.915 kg)  12/04/15 185 lb (83.915 kg)  10/29/15 182 lb 12.2 oz (82.9 kg)  10/26/15 190 lb (86.183 kg)  08/26/15 193 lb (87.544 kg)  02/02/15 189 lb (85.73 kg)  01/27/13 195 lb 15.8 oz (88.9 kg)  01/27/13 196 lb (88.905 kg)    Body mass index is 28.78 kg/(m^2). Patient meets criteria for overweight based on current BMI.   Current diet order is regular, patient is consuming approximately 50-100% of meals at this time. Labs and medications reviewed.   No nutrition interventions warranted at this time. If nutrition issues arise, please consult RD.   Adeleine Pask A. Jimmye Norman, RD, LDN, CDE Pager: [phone] After hours Pager: [phone]

## 2016-04-23 NOTE — Progress Notes (Signed)
5 Days Post-Op  Subjective: No complaints. Passing flatus  Objective: Vital signs in last 24 hours: Temp:  [98.2 F (36.8 C)-99.1 F (37.3 C)] 98.3 F (36.8 C) (06/13 0602) Pulse Rate:  [58-96] 58 (06/13 0602) Resp:  [16-18] 16 (06/13 0602) BP: (119-132)/(66-83) 127/66 mmHg (06/13 0602) SpO2:  [97 %-99 %] 99 % (06/13 0602) Last BM Date: 04/18/16  Intake/Output from previous day: 06/12 0701 - 06/13 0700 In: 700 [P.O.:700] Out: 3425 [Urine:3425] Intake/Output this shift:    Resp: clear to auscultation bilaterally Cardio: regular rate and rhythm GI: soft, nontender. incisions look good. good bs.  Lab Results:   Recent Labs  04/23/16 0421  WBC 7.9  HGB 14.3  HCT 42.0  PLT 277   BMET  Recent Labs  04/23/16 0421  NA 139  K 4.1  CL 104  CO2 29  GLUCOSE 109*  BUN 7  CREATININE 1.00  CALCIUM 9.5   PT/INR No results for input(s): LABPROT, INR in the last 72 hours. ABG No results for input(s): PHART, HCO3 in the last 72 hours.  Invalid input(s): PCO2, PO2  Studies/Results: No results found.  Anti-infectives: Anti-infectives    Start     Dose/Rate Route Frequency Ordered Stop   04/18/16 1200  ciprofloxacin (CIPRO) IVPB 400 mg  Status:  Discontinued     400 mg 200 mL/hr over 60 Minutes Intravenous On call to O.R. 04/18/16 1033 04/18/16 1704   04/18/16 1200  metroNIDAZOLE (FLAGYL) IVPB 500 mg     500 mg 100 mL/hr over 60 Minutes Intravenous On call to O.R. 04/18/16 1033 04/18/16 1357   04/18/16 1032  ciprofloxacin (CIPRO) 400 MG/200ML IVPB    Comments:  Laurita Quint   : cabinet override      04/18/16 1032 04/18/16 1323      Assessment/Plan: s/p Procedure(s): LAPAROSCOPIC ASSISTED  SIGMOID COLECTOMY (N/A) Advance diet. Start regular food Ambulate Plan for discharge tomorrow with foley in  LOS: 5 days    TOTH III,Moneisha Vosler S 04/23/2016

## 2016-04-24 MED ORDER — HYDROCODONE-ACETAMINOPHEN 5-325 MG PO TABS: 1.0000 | ORAL_TABLET | ORAL | Status: DC | PRN

## 2016-04-24 MED ORDER — SILODOSIN 8 MG PO CAPS: 8.0000 mg | ORAL_CAPSULE | Freq: Every day | ORAL | Status: DC

## 2016-04-24 NOTE — Progress Notes (Signed)
Discharge papers gone over with pt. Prescriptions given to pt. No questions/complaints. IV taken out. Foley attached to leg bag. Pt. Will let us know when he is ready so we can call for a w/c.

## 2016-04-24 NOTE — Progress Notes (Signed)
6 Days Post-Op  Subjective: No complaints. Had a bm last night  Objective: Vital signs in last 24 hours: Temp:  [98.1 F (36.7 C)-98.6 F (37 C)] 98.6 F (37 C) (06/14 0553) Pulse Rate:  [58-82] 58 (06/14 0553) Resp:  [18] 18 (06/14 0553) BP: (123-131)/(59-71) 131/62 mmHg (06/14 0553) SpO2:  [96 %-100 %] 97 % (06/14 0553) Last BM Date: 04/23/16  Intake/Output from previous day: 06/13 0701 - 06/14 0700 In: 960 [P.O.:960] Out: 2425 [Urine:2425] Intake/Output this shift:    Resp: clear to auscultation bilaterally Cardio: regular rate and rhythm GI: soft, nontender. incisions look good  Lab Results:   Recent Labs  04/23/16 0421  WBC 7.9  HGB 14.3  HCT 42.0  PLT 277   BMET  Recent Labs  04/23/16 0421  NA 139  K 4.1  CL 104  CO2 29  GLUCOSE 109*  BUN 7  CREATININE 1.00  CALCIUM 9.5   PT/INR No results for input(s): LABPROT, INR in the last 72 hours. ABG No results for input(s): PHART, HCO3 in the last 72 hours.  Invalid input(s): PCO2, PO2  Studies/Results: No results found.  Anti-infectives: Anti-infectives    Start     Dose/Rate Route Frequency Ordered Stop   04/18/16 1200  ciprofloxacin (CIPRO) IVPB 400 mg  Status:  Discontinued     400 mg 200 mL/hr over 60 Minutes Intravenous On call to O.R. 04/18/16 1033 04/18/16 1704   04/18/16 1200  metroNIDAZOLE (FLAGYL) IVPB 500 mg     500 mg 100 mL/hr over 60 Minutes Intravenous On call to O.R. 04/18/16 1033 04/18/16 1357   04/18/16 1032  ciprofloxacin (CIPRO) 400 MG/200ML IVPB    Comments:  Laurita Quint   : cabinet override      04/18/16 1032 04/18/16 1323      Assessment/Plan: s/p Procedure(s): LAPAROSCOPIC ASSISTED  SIGMOID COLECTOMY (N/A) Advance diet Discharge  LOS: 6 days    TOTH III,Liliya Fullenwider S 04/24/2016

## 2016-04-25 NOTE — Discharge Summary (Signed)
Physician Discharge Summary  Patient ID: Keith TENNENBAUM, MD MRN: [id number] DOB/AGE: [birth year] 70 y.o.  Admit date: 04/18/2016 Discharge date: 04/25/2016  Admission Diagnoses:  Discharge Diagnoses:  Active Problems:   Sigmoid diverticulitis   Discharged Condition: good  Hospital Course: The patient underwent a laparoscopic assisted sigmoid colectomy for recurrent diverticulitis. He tolerated the surgery well. Postoperatively it took several days for full return of his bowel function but his course was unremarkable. Once he was able to tolerate a diet and have a bowel movement then he was ready for discharge home  Consults: None  Significant Diagnostic Studies: none  Treatments: surgery: as above  Discharge Exam: Blood pressure 126/68, pulse 59, temperature 98.6 F (37 C), temperature source Oral, resp. rate 18, height 5\' 9"  (1.753 m), weight 88.451 kg (195 lb), SpO2 97 %. Resp: clear to auscultation bilaterally Cardio: regular rate and rhythm GI: soft, nontender. incisions look good  Disposition: 01-Home or Self Care  Discharge Instructions    Call MD for:  difficulty breathing, headache or visual disturbances    Complete by:  As directed      Call MD for:  extreme fatigue    Complete by:  As directed      Call MD for:  hives    Complete by:  As directed      Call MD for:  persistant dizziness or light-headedness    Complete by:  As directed      Call MD for:  persistant nausea and vomiting    Complete by:  As directed      Call MD for:  redness, tenderness, or signs of infection (pain, swelling, redness, odor or green/yellow discharge around incision site)    Complete by:  As directed      Call MD for:  severe uncontrolled pain    Complete by:  As directed      Call MD for:  temperature >100.4    Complete by:  As directed      Diet - low sodium heart healthy    Complete by:  As directed      Discharge instructions    Complete by:  As directed   May shower. No  heavy lifting. Use colace and miralax to avoid constipation. Diet as tolerated. Call urology to remove foley     Increase activity slowly    Complete by:  As directed      No wound care    Complete by:  As directed             Medication List    TAKE these medications        amLODipine 10 MG tablet  Commonly known as:  NORVASC  Take 10 mg by mouth daily.     aspirin 81 MG tablet  Take 81 mg by mouth daily.     buPROPion 150 MG 24 hr tablet  Commonly known as:  WELLBUTRIN XL  Take 450 mg by mouth daily.     ciprofloxacin 750 MG tablet  Commonly known as:  CIPRO  Take 1 tablet by mouth 2 (two) times daily.     folic acid Q000111Q MCG tablet  Commonly known as:  FOLVITE  Take 800 mcg by mouth daily.     HYDROcodone-acetaminophen 5-325 MG tablet  Commonly known as:  NORCO/VICODIN  Take 1-2 tablets by mouth every 4 (four) hours as needed for moderate pain or severe pain.     ibuprofen 200 MG tablet  Commonly known as:  ADVIL,MOTRIN  Take 800 mg by mouth every 6 (six) hours as needed for fever, headache, mild pain, moderate pain or cramping.     metoprolol succinate 50 MG 24 hr tablet  Commonly known as:  TOPROL-XL  Take 100 mg by mouth daily. Take with or immediately following a meal.     metroNIDAZOLE 500 MG tablet  Commonly known as:  FLAGYL  Take 1 tablet by mouth 3 (three) times daily.     PROBIOTIC DAILY PO  Take 1 capsule by mouth daily.     ranitidine 150 MG tablet  Commonly known as:  ZANTAC  Take 300 mg by mouth daily.     rosuvastatin 10 MG tablet  Commonly known as:  CRESTOR  Take 10 mg by mouth every Monday, Wednesday, and Friday.     silodosin 8 MG Caps capsule  Commonly known as:  RAPAFLO  Take 1 capsule (8 mg total) by mouth daily with supper.     vitamin C 500 MG tablet  Commonly known as:  ASCORBIC ACID  Take 500 mg by mouth daily.     Vitamin D3 5000 units Tabs  Take 5,000 Units by mouth daily.           Follow-up Information     Follow up with Merrie Roof, MD In 1 week.   Specialty:  General Surgery   Contact information:   [place] [address] [phone]       Signed: Merrie Roof 04/25/2016, 10:04 AM

## 2016-11-20 DIAGNOSIS — N401 Enlarged prostate with lower urinary tract symptoms: Secondary | ICD-10-CM | POA: Diagnosis not present

## 2016-12-05 DIAGNOSIS — R351 Nocturia: Secondary | ICD-10-CM | POA: Diagnosis not present

## 2016-12-05 DIAGNOSIS — R972 Elevated prostate specific antigen [PSA]: Secondary | ICD-10-CM | POA: Diagnosis not present

## 2016-12-05 DIAGNOSIS — N401 Enlarged prostate with lower urinary tract symptoms: Secondary | ICD-10-CM | POA: Diagnosis not present

## 2016-12-23 ENCOUNTER — Encounter (HOSPITAL_COMMUNITY): Payer: Self-pay

## 2016-12-23 ENCOUNTER — Emergency Department (HOSPITAL_COMMUNITY): Payer: MEDICARE

## 2016-12-23 ENCOUNTER — Inpatient Hospital Stay (HOSPITAL_COMMUNITY)
Admission: EM | Admit: 2016-12-23 | Discharge: 2016-12-25 | DRG: 287 | Disposition: A | Discharge status: Home / Self Care | Payer: MEDICARE | Attending: Cardiology | Admitting: Cardiology

## 2016-12-23 DIAGNOSIS — I10 Essential (primary) hypertension: Secondary | ICD-10-CM | POA: Diagnosis present

## 2016-12-23 DIAGNOSIS — R002 Palpitations: Secondary | ICD-10-CM | POA: Diagnosis not present

## 2016-12-23 DIAGNOSIS — Z7982 Long term (current) use of aspirin: Secondary | ICD-10-CM | POA: Diagnosis not present

## 2016-12-23 DIAGNOSIS — Z91041 Radiographic dye allergy status: Secondary | ICD-10-CM | POA: Diagnosis not present

## 2016-12-23 DIAGNOSIS — Z981 Arthrodesis status: Secondary | ICD-10-CM | POA: Diagnosis not present

## 2016-12-23 DIAGNOSIS — I209 Angina pectoris, unspecified: Secondary | ICD-10-CM | POA: Diagnosis not present

## 2016-12-23 DIAGNOSIS — Z888 Allergy status to other drugs, medicaments and biological substances status: Secondary | ICD-10-CM | POA: Diagnosis not present

## 2016-12-23 DIAGNOSIS — F4310 Post-traumatic stress disorder, unspecified: Secondary | ICD-10-CM | POA: Diagnosis not present

## 2016-12-23 DIAGNOSIS — Z85828 Personal history of other malignant neoplasm of skin: Secondary | ICD-10-CM

## 2016-12-23 DIAGNOSIS — I200 Unstable angina: Secondary | ICD-10-CM | POA: Diagnosis not present

## 2016-12-23 DIAGNOSIS — H9319 Tinnitus, unspecified ear: Secondary | ICD-10-CM | POA: Diagnosis not present

## 2016-12-23 DIAGNOSIS — F329 Major depressive disorder, single episode, unspecified: Secondary | ICD-10-CM | POA: Diagnosis not present

## 2016-12-23 DIAGNOSIS — I493 Ventricular premature depolarization: Secondary | ICD-10-CM | POA: Diagnosis not present

## 2016-12-23 DIAGNOSIS — K219 Gastro-esophageal reflux disease without esophagitis: Secondary | ICD-10-CM | POA: Diagnosis not present

## 2016-12-23 DIAGNOSIS — E785 Hyperlipidemia, unspecified: Secondary | ICD-10-CM | POA: Diagnosis present

## 2016-12-23 DIAGNOSIS — R079 Chest pain, unspecified: Secondary | ICD-10-CM | POA: Diagnosis present

## 2016-12-23 DIAGNOSIS — J45909 Unspecified asthma, uncomplicated: Secondary | ICD-10-CM | POA: Diagnosis present

## 2016-12-23 DIAGNOSIS — N401 Enlarged prostate with lower urinary tract symptoms: Secondary | ICD-10-CM | POA: Diagnosis present

## 2016-12-23 DIAGNOSIS — Z79899 Other long term (current) drug therapy: Secondary | ICD-10-CM

## 2016-12-23 DIAGNOSIS — E7800 Pure hypercholesterolemia, unspecified: Secondary | ICD-10-CM | POA: Diagnosis not present

## 2016-12-23 DIAGNOSIS — R69 Illness, unspecified: Secondary | ICD-10-CM | POA: Diagnosis not present

## 2016-12-23 DIAGNOSIS — R0789 Other chest pain: Secondary | ICD-10-CM | POA: Diagnosis not present

## 2016-12-23 LAB — BASIC METABOLIC PANEL
Anion gap: 7 (ref 5–15)
BUN: 9 mg/dL (ref 6–20)
CHLORIDE: 108 mmol/L (ref 101–111)
CO2: 25 mmol/L (ref 22–32)
CREATININE: 0.96 mg/dL (ref 0.61–1.24)
Calcium: 9.3 mg/dL (ref 8.9–10.3)
GFR calc non Af Amer: >60 mL/min (ref >60)
Glucose, Bld: 92 mg/dL (ref 65–99)
POTASSIUM: 3.5 mmol/L (ref 3.5–5.1)
SODIUM: 140 mmol/L (ref 135–145)

## 2016-12-23 LAB — CBC
HEMATOCRIT: 43.7 % (ref 39.0–52.0)
Hemoglobin: 15.2 g/dL (ref 13.0–17.0)
MCH: 30.3 pg (ref 26.0–34.0)
MCHC: 34.8 g/dL (ref 30.0–36.0)
MCV: 87.1 fL (ref 78.0–100.0)
PLATELETS: 256 10*3/uL (ref 150–400)
RBC: 5.02 MIL/uL (ref 4.22–5.81)
RDW: 12.4 % (ref 11.5–15.5)
WBC: 7.5 10*3/uL (ref 4.0–10.5)

## 2016-12-23 LAB — PROTIME-INR
INR: 1.09
PROTHROMBIN TIME: 14.2 s (ref 11.4–15.2)

## 2016-12-23 LAB — I-STAT TROPONIN, ED: Troponin i, poc: 0.01 ng/mL (ref 0.00–0.08)

## 2016-12-23 LAB — TROPONIN I

## 2016-12-23 MED ORDER — ASPIRIN 81 MG PO CHEW: 324.0000 mg | CHEWABLE_TABLET | ORAL | Status: AC

## 2016-12-23 MED ORDER — SODIUM CHLORIDE 0.9% FLUSH: 3.0000 mL | INTRAVENOUS | Status: DC | PRN

## 2016-12-23 MED ORDER — ROSUVASTATIN CALCIUM 10 MG PO TABS: 10.0000 mg | ORAL_TABLET | ORAL | Status: DC

## 2016-12-23 MED ORDER — SODIUM CHLORIDE 0.9 % IV SOLN: 250.0000 mL | INTRAVENOUS | Status: DC | PRN

## 2016-12-23 MED ORDER — SODIUM CHLORIDE 0.9 % WEIGHT BASED INFUSION
1.0000 mL/kg/h | INTRAVENOUS | Status: DC
  Administered 2016-12-24: 1 mL/kg/h via INTRAVENOUS
  Administered 2016-12-24: 250 mL via INTRAVENOUS

## 2016-12-23 MED ORDER — HEPARIN BOLUS VIA INFUSION
4000.0000 [IU] | Freq: Once | INTRAVENOUS | Status: AC
  Administered 2016-12-23: 4000 [IU] via INTRAVENOUS
  Filled 2016-12-23: qty 4000

## 2016-12-23 MED ORDER — AMLODIPINE BESYLATE 10 MG PO TABS
10.0000 mg | ORAL_TABLET | Freq: Every day | ORAL | Status: DC
  Administered 2016-12-24 – 2016-12-25 (×2): 10 mg via ORAL
  Filled 2016-12-23 (×2): qty 1

## 2016-12-23 MED ORDER — ASPIRIN 81 MG PO TABS: 81.0000 mg | ORAL_TABLET | Freq: Every day | ORAL | Status: DC

## 2016-12-23 MED ORDER — SODIUM CHLORIDE 0.9% FLUSH
3.0000 mL | Freq: Two times a day (BID) | INTRAVENOUS | Status: DC
  Administered 2016-12-24: 3 mL via INTRAVENOUS

## 2016-12-23 MED ORDER — HEPARIN (PORCINE) IN NACL 100-0.45 UNIT/ML-% IJ SOLN
1100.0000 [IU]/h | INTRAMUSCULAR | Status: DC
Start: 2016-12-23 — End: 2016-12-24
  Administered 2016-12-23: 1000 [IU]/h via INTRAVENOUS
  Filled 2016-12-23 (×2): qty 250

## 2016-12-23 MED ORDER — FAMOTIDINE 20 MG PO TABS
40.0000 mg | ORAL_TABLET | Freq: Every day | ORAL | Status: DC
  Administered 2016-12-24 – 2016-12-25 (×2): 40 mg via ORAL
  Filled 2016-12-23 (×2): qty 2

## 2016-12-23 MED ORDER — NITROGLYCERIN 0.4 MG SL SUBL
0.4000 mg | SUBLINGUAL_TABLET | SUBLINGUAL | Status: DC | PRN
  Administered 2016-12-24: 0.4 mg via SUBLINGUAL
  Filled 2016-12-23: qty 1

## 2016-12-23 MED ORDER — METOPROLOL SUCCINATE ER 100 MG PO TB24
100.0000 mg | ORAL_TABLET | Freq: Every day | ORAL | Status: DC
  Administered 2016-12-24 – 2016-12-25 (×2): 100 mg via ORAL
  Filled 2016-12-23 (×2): qty 1

## 2016-12-23 MED ORDER — DIPHENHYDRAMINE HCL 50 MG/ML IJ SOLN
25.0000 mg | INTRAMUSCULAR | Status: AC
  Administered 2016-12-24: 25 mg via INTRAVENOUS
  Filled 2016-12-23 (×2): qty 1

## 2016-12-23 MED ORDER — ASPIRIN 300 MG RE SUPP: 300.0000 mg | RECTAL | Status: AC

## 2016-12-23 MED ORDER — VITAMIN D3 25 MCG (1000 UNIT) PO TABS
5000.0000 [IU] | ORAL_TABLET | Freq: Every day | ORAL | Status: DC
Start: 2016-12-24 — End: 2016-12-24
  Administered 2016-12-24: 5000 [IU] via ORAL
  Filled 2016-12-23 (×2): qty 5

## 2016-12-23 MED ORDER — METHYLPREDNISOLONE SODIUM SUCC 125 MG IJ SOLR
125.0000 mg | INTRAMUSCULAR | Status: AC
Start: 2016-12-24 — End: 2016-12-24
  Administered 2016-12-24: 125 mg via INTRAVENOUS
  Filled 2016-12-23 (×2): qty 2

## 2016-12-23 MED ORDER — ZOLPIDEM TARTRATE 5 MG PO TABS: 5.0000 mg | ORAL_TABLET | Freq: Every evening | ORAL | Status: DC | PRN

## 2016-12-23 MED ORDER — BUPROPION HCL ER (XL) 150 MG PO TB24
450.0000 mg | ORAL_TABLET | Freq: Every day | ORAL | Status: DC
  Administered 2016-12-24 – 2016-12-25 (×2): 450 mg via ORAL
  Filled 2016-12-23 (×2): qty 3

## 2016-12-23 MED ORDER — VITAMIN C 500 MG PO TABS
500.0000 mg | ORAL_TABLET | Freq: Every day | ORAL | Status: DC
  Administered 2016-12-24: 500 mg via ORAL
  Filled 2016-12-23: qty 1

## 2016-12-23 MED ORDER — PREDNISONE 20 MG PO TABS
60.0000 mg | ORAL_TABLET | ORAL | Status: AC
  Administered 2016-12-23: 60 mg via ORAL

## 2016-12-23 MED ORDER — ONDANSETRON HCL 4 MG/2ML IJ SOLN: 4.0000 mg | Freq: Four times a day (QID) | INTRAMUSCULAR | Status: DC | PRN

## 2016-12-23 MED ORDER — ASPIRIN EC 81 MG PO TBEC
81.0000 mg | DELAYED_RELEASE_TABLET | Freq: Every day | ORAL | Status: DC
  Administered 2016-12-24 – 2016-12-25 (×2): 81 mg via ORAL
  Filled 2016-12-23 (×2): qty 1

## 2016-12-23 MED ORDER — IBUPROFEN 800 MG PO TABS: 800.0000 mg | ORAL_TABLET | Freq: Four times a day (QID) | ORAL | Status: DC | PRN

## 2016-12-23 MED ORDER — PREDNISONE 20 MG PO TABS
60.0000 mg | ORAL_TABLET | Freq: Every day | ORAL | Status: AC
  Administered 2016-12-24: 60 mg via ORAL
  Filled 2016-12-23 (×2): qty 3

## 2016-12-23 MED ORDER — FOLIC ACID 1 MG PO TABS
1.0000 mg | ORAL_TABLET | Freq: Every day | ORAL | Status: DC
  Administered 2016-12-24 – 2016-12-25 (×2): 1 mg via ORAL
  Filled 2016-12-23 (×2): qty 1

## 2016-12-23 MED ORDER — ACETAMINOPHEN 325 MG PO TABS: 650.0000 mg | ORAL_TABLET | ORAL | Status: DC | PRN

## 2016-12-23 NOTE — ED Triage Notes (Signed)
Pt brought in by [facility]. Pt c/o substernal centralized chest pain with no radiation. Pt denies dizziness, nausea, and pain at this time. Pt given 324 of ASA on transport. Pt in NAD.
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
## 2016-12-23 NOTE — ED Notes (Signed)
Report attempted 

## 2016-12-23 NOTE — ED Notes (Signed)
EKG given to Dr. Allen 
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
## 2016-12-24 ENCOUNTER — Encounter (HOSPITAL_COMMUNITY)
Admission: EM | Disposition: A | Discharge status: Home / Self Care | Payer: Self-pay | Source: Home / Self Care | Attending: Cardiology

## 2016-12-24 DIAGNOSIS — E7800 Pure hypercholesterolemia, unspecified: Secondary | ICD-10-CM | POA: Diagnosis not present

## 2016-12-24 DIAGNOSIS — J45909 Unspecified asthma, uncomplicated: Secondary | ICD-10-CM | POA: Diagnosis not present

## 2016-12-24 DIAGNOSIS — I10 Essential (primary) hypertension: Secondary | ICD-10-CM | POA: Diagnosis not present

## 2016-12-24 DIAGNOSIS — R69 Illness, unspecified: Secondary | ICD-10-CM | POA: Diagnosis not present

## 2016-12-24 DIAGNOSIS — N401 Enlarged prostate with lower urinary tract symptoms: Secondary | ICD-10-CM | POA: Diagnosis not present

## 2016-12-24 DIAGNOSIS — R002 Palpitations: Secondary | ICD-10-CM | POA: Diagnosis not present

## 2016-12-24 DIAGNOSIS — I493 Ventricular premature depolarization: Secondary | ICD-10-CM | POA: Diagnosis not present

## 2016-12-24 DIAGNOSIS — I209 Angina pectoris, unspecified: Secondary | ICD-10-CM | POA: Diagnosis not present

## 2016-12-24 DIAGNOSIS — I200 Unstable angina: Secondary | ICD-10-CM | POA: Diagnosis not present

## 2016-12-24 DIAGNOSIS — E785 Hyperlipidemia, unspecified: Secondary | ICD-10-CM | POA: Diagnosis not present

## 2016-12-24 DIAGNOSIS — Z91041 Radiographic dye allergy status: Secondary | ICD-10-CM | POA: Diagnosis not present

## 2016-12-24 HISTORY — PX: LEFT HEART CATH AND CORONARY ANGIOGRAPHY: CATH118249

## 2016-12-24 LAB — LIPID PANEL
Cholesterol: 135 mg/dL (ref 0–200)
HDL: 47 mg/dL (ref >40)
LDL Cholesterol: 78 mg/dL (ref 0–99)
Total CHOL/HDL Ratio: 2.9 RATIO
Triglycerides: 48 mg/dL (ref <150)
VLDL: 10 mg/dL (ref 0–40)

## 2016-12-24 LAB — CBC
HCT: 42 % (ref 39.0–52.0)
HEMOGLOBIN: 15.1 g/dL (ref 13.0–17.0)
MCH: 31 pg (ref 26.0–34.0)
MCHC: 36 g/dL (ref 30.0–36.0)
MCV: 86.2 fL (ref 78.0–100.0)
Platelets: 263 10*3/uL (ref 150–400)
RBC: 4.87 MIL/uL (ref 4.22–5.81)
RDW: 12.2 % (ref 11.5–15.5)
WBC: 7.4 10*3/uL (ref 4.0–10.5)

## 2016-12-24 LAB — TROPONIN I: TROPONIN I: 0.04 ng/mL — AB (ref <0.03)

## 2016-12-24 LAB — HEPARIN LEVEL (UNFRACTIONATED)
HEPARIN UNFRACTIONATED: 0.33 [IU]/mL (ref 0.30–0.70)
Heparin Unfractionated: 0.45 IU/mL (ref 0.30–0.70)

## 2016-12-24 LAB — MAGNESIUM: Magnesium: 1.9 mg/dL (ref 1.7–2.4)

## 2016-12-24 LAB — MRSA PCR SCREENING: MRSA BY PCR: NEGATIVE

## 2016-12-24 SURGERY — LEFT HEART CATH AND CORONARY ANGIOGRAPHY
Anesthesia: LOCAL

## 2016-12-24 MED ORDER — SODIUM CHLORIDE 0.9 % WEIGHT BASED INFUSION
1.0000 mL/kg/h | INTRAVENOUS | Status: AC
  Administered 2016-12-24: 1 mL/kg/h via INTRAVENOUS

## 2016-12-24 MED ORDER — SODIUM CHLORIDE 0.9 % IV SOLN: 250.0000 mL | INTRAVENOUS | Status: DC | PRN

## 2016-12-24 MED ORDER — SODIUM CHLORIDE 0.9% FLUSH: 3.0000 mL | Freq: Two times a day (BID) | INTRAVENOUS | Status: DC

## 2016-12-24 MED ORDER — HEPARIN (PORCINE) IN NACL 2-0.9 UNIT/ML-% IJ SOLN
INTRAMUSCULAR | Status: DC | PRN
  Administered 2016-12-24: 1000 mL

## 2016-12-24 MED ORDER — HYDROMORPHONE HCL 1 MG/ML IJ SOLN
INTRAMUSCULAR | Status: AC
  Filled 2016-12-24: qty 1

## 2016-12-24 MED ORDER — LIDOCAINE HCL (PF) 1 % IJ SOLN
INTRAMUSCULAR | Status: AC
  Filled 2016-12-24: qty 30

## 2016-12-24 MED ORDER — IOPAMIDOL (ISOVUE-370) INJECTION 76%
INTRAVENOUS | Status: AC
Start: 2016-12-24 — End: 2016-12-24
  Filled 2016-12-24: qty 100

## 2016-12-24 MED ORDER — MIDAZOLAM HCL 2 MG/2ML IJ SOLN
INTRAMUSCULAR | Status: AC
  Filled 2016-12-24: qty 2

## 2016-12-24 MED ORDER — VERAPAMIL HCL 2.5 MG/ML IV SOLN
INTRAVENOUS | Status: AC
  Filled 2016-12-24: qty 2

## 2016-12-24 MED ORDER — HYDROMORPHONE HCL 1 MG/ML IJ SOLN
INTRAMUSCULAR | Status: DC | PRN
Start: 2016-12-24 — End: 2016-12-24
  Administered 2016-12-24 (×2): 0.5 mg via INTRAVENOUS

## 2016-12-24 MED ORDER — SODIUM CHLORIDE 0.9% FLUSH: 3.0000 mL | INTRAVENOUS | Status: DC | PRN

## 2016-12-24 MED ORDER — NITROGLYCERIN 1 MG/10 ML FOR IR/CATH LAB
INTRA_ARTERIAL | Status: AC
  Filled 2016-12-24: qty 10

## 2016-12-24 MED ORDER — HEPARIN (PORCINE) IN NACL 2-0.9 UNIT/ML-% IJ SOLN
INTRAMUSCULAR | Status: AC
  Filled 2016-12-24: qty 1000

## 2016-12-24 MED ORDER — LIDOCAINE HCL (PF) 1 % IJ SOLN
INTRAMUSCULAR | Status: DC | PRN
  Administered 2016-12-24: 2 mL

## 2016-12-24 MED ORDER — VERAPAMIL HCL 2.5 MG/ML IV SOLN
INTRA_ARTERIAL | Status: DC | PRN
  Administered 2016-12-24: 8 mL via INTRA_ARTERIAL

## 2016-12-24 MED ORDER — IOPAMIDOL (ISOVUE-370) INJECTION 76%
INTRAVENOUS | Status: DC | PRN
  Administered 2016-12-24: 60 mL via INTRA_ARTERIAL

## 2016-12-24 MED ORDER — MIDAZOLAM HCL 2 MG/2ML IJ SOLN
INTRAMUSCULAR | Status: DC | PRN
  Administered 2016-12-24 (×2): 2 mg via INTRAVENOUS

## 2016-12-24 SURGICAL SUPPLY — 10 items
CATH EXPO 5FR ANG PIGTAIL 145 (CATHETERS) ×1 IMPLANT
CATH OPTITORQUE TIG 4.0 5F (CATHETERS) ×1 IMPLANT
DEVICE RAD COMP TR BAND LRG (VASCULAR PRODUCTS) ×1 IMPLANT
GLIDESHEATH SLEND A-KIT 6F 20G (SHEATH) ×2 IMPLANT
GUIDEWIRE INQWIRE 1.5J.035X260 (WIRE) IMPLANT
INQWIRE 1.5J .035X260CM (WIRE) ×2
KIT HEART LEFT (KITS) ×2 IMPLANT
PACK CARDIAC CATHETERIZATION (CUSTOM PROCEDURE TRAY) ×2 IMPLANT
TRANSDUCER W/STOPCOCK (MISCELLANEOUS) ×2 IMPLANT
TUBING CIL FLEX 10 FLL-RA (TUBING) ×2 IMPLANT

## 2016-12-24 NOTE — Care Management Note (Signed)
Case Management Note  Patient Details  Name: Keith HUDELSON, MD MRN: [id number] Date of Birth: 1946/06/03  Subjective/Objective:   Chest pain                 Action/Plan: Discharge Planning: NCM spoke to pt at bedside. Waiting Cardiac Cath, scheduled 12/24/2016 at 130 pm. Will continue to follow for dc needs.   PCP Gaynelle Arabian MD  Expected Discharge Date:               Expected Discharge Plan:  Home/Self Care  In-House Referral:  NA  Discharge planning Services  CM Consult  Post Acute Care Choice:  NA Choice offered to:  NA  DME Arranged:  N/A DME Agency:  NA  HH Arranged:  NA HH Agency:  NA  Status of Service:  In process, will continue to follow  If discussed at Long Length of Stay Meetings, dates discussed:    Additional Comments:  Erenest Rasher, RN 12/24/2016, 11:59 AM
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
## 2016-12-24 NOTE — Progress Notes (Signed)
ANTICOAGULATION CONSULT NOTE - Follow Up Consult  Pharmacy Consult for heparin Indication: chest pain/ACS  Labs:  Recent Labs  12/23/16 1800 12/23/16 2145 12/24/16 0401  HGB 15.2  --  15.1  HCT 43.7  --  42.0  PLT 256  --  263  LABPROT  --  14.2  --   INR  --  1.09  --   HEPARINUNFRC  --   --  0.33  CREATININE 0.96  --   --   TROPONINI  --  <0.03  --     Assessment: 70yo male therapeutic on heparin with initial dosing for CP though at very low end of goal.  Goal of Therapy:  Heparin level 0.3-0.7 units/ml   Plan:  Will increase heparin gtt slightly to 1100 units/hr and check level in 6hr.  Wynona Neat, PharmD, BCPS  12/24/2016,4:48 AM

## 2016-12-24 NOTE — Interval H&P Note (Signed)
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 After consideration of risks, benefits and other options for treatment, the patient has consented to  Procedure(s): Left Heart Cath and Coronary Angiography (N/A) as a surgical intervention .  The patient's history has been reviewed, patient examined, no change in status, stable for surgery.  I have reviewed the patient's chart and labs.  Questions were answered to the patient's satisfaction.     [name] Cath Lab Visit (complete for each Cath Lab visit)  Clinical Evaluation Leading to the Procedure:   ACS: Yes.    Non-ACS:    Anginal Classification: CCS IV  Anti-ischemic medical therapy: Minimal Therapy (1 class of medications)  Non-Invasive Test Results: No non-invasive testing performed  Prior CABG: No previous CABG

## 2016-12-24 NOTE — Progress Notes (Signed)
ANTICOAGULATION CONSULT NOTE - Follow Up Consult  Pharmacy Consult for heparin Indication: chest pain/ACS  Labs:  Recent Labs  12/23/16 1800 12/23/16 2145 12/24/16 0401 12/24/16 1108  HGB 15.2  --  15.1  --   HCT 43.7  --  42.0  --   PLT 256  --  263  --   LABPROT  --  14.2  --   --   INR  --  1.09  --   --   HEPARINUNFRC  --   --  0.33 0.45  CREATININE 0.96  --   --   --   TROPONINI  --  <0.03 <0.03 0.04*    Assessment: 71yo male therapeutic on heparin with initial dosing for CP.  No oral AC PTA HL = 0.45 on 1100 units/hr,   HL therapeutic x2 , no bleeding noted, CBC wnl  Waiting Cardiac Cath, scheduled 12/24/2016 at 130 pm  Goal of Therapy:  Heparin level 0.3-0.7 units/ml   Plan:  Continue IV heparin gtt 1100 units/hr Daily HL, CBC F/u after cardiac cath  Nicole Cella, RPh Clinical Pharmacist Pager: [phone] 8A-4P [phone] 4P-10P [phone] [place] [phone]  12/24/2016,1:36 PM

## 2016-12-24 NOTE — Care Management Obs Status (Signed)
[facility] NOTIFICATION   Patient Details  Name: Keith STEFFY, MD MRN: [id number] Date of Birth: 1946-08-08   Medicare Observation Status Notification Given:  Yes    Erenest Rasher, RN 12/24/2016, 11:27 AM

## 2016-12-25 ENCOUNTER — Encounter (HOSPITAL_COMMUNITY): Payer: Self-pay | Admitting: Cardiology

## 2016-12-25 DIAGNOSIS — I10 Essential (primary) hypertension: Secondary | ICD-10-CM | POA: Diagnosis present

## 2016-12-25 DIAGNOSIS — I209 Angina pectoris, unspecified: Secondary | ICD-10-CM | POA: Diagnosis not present

## 2016-12-25 DIAGNOSIS — H9319 Tinnitus, unspecified ear: Secondary | ICD-10-CM | POA: Diagnosis present

## 2016-12-25 DIAGNOSIS — Z888 Allergy status to other drugs, medicaments and biological substances status: Secondary | ICD-10-CM | POA: Diagnosis not present

## 2016-12-25 DIAGNOSIS — Z91041 Radiographic dye allergy status: Secondary | ICD-10-CM | POA: Diagnosis not present

## 2016-12-25 DIAGNOSIS — R002 Palpitations: Secondary | ICD-10-CM | POA: Diagnosis present

## 2016-12-25 DIAGNOSIS — F329 Major depressive disorder, single episode, unspecified: Secondary | ICD-10-CM | POA: Diagnosis present

## 2016-12-25 DIAGNOSIS — J45909 Unspecified asthma, uncomplicated: Secondary | ICD-10-CM | POA: Diagnosis present

## 2016-12-25 DIAGNOSIS — K219 Gastro-esophageal reflux disease without esophagitis: Secondary | ICD-10-CM | POA: Diagnosis present

## 2016-12-25 DIAGNOSIS — I200 Unstable angina: Secondary | ICD-10-CM | POA: Diagnosis present

## 2016-12-25 DIAGNOSIS — E7800 Pure hypercholesterolemia, unspecified: Secondary | ICD-10-CM | POA: Diagnosis present

## 2016-12-25 DIAGNOSIS — Z85828 Personal history of other malignant neoplasm of skin: Secondary | ICD-10-CM | POA: Diagnosis not present

## 2016-12-25 DIAGNOSIS — E785 Hyperlipidemia, unspecified: Secondary | ICD-10-CM | POA: Diagnosis present

## 2016-12-25 DIAGNOSIS — R079 Chest pain, unspecified: Secondary | ICD-10-CM | POA: Diagnosis present

## 2016-12-25 DIAGNOSIS — Z7982 Long term (current) use of aspirin: Secondary | ICD-10-CM | POA: Diagnosis not present

## 2016-12-25 DIAGNOSIS — Z79899 Other long term (current) drug therapy: Secondary | ICD-10-CM | POA: Diagnosis not present

## 2016-12-25 DIAGNOSIS — F4310 Post-traumatic stress disorder, unspecified: Secondary | ICD-10-CM | POA: Diagnosis present

## 2016-12-25 DIAGNOSIS — N401 Enlarged prostate with lower urinary tract symptoms: Secondary | ICD-10-CM | POA: Diagnosis present

## 2016-12-25 DIAGNOSIS — Z981 Arthrodesis status: Secondary | ICD-10-CM | POA: Diagnosis not present

## 2016-12-25 DIAGNOSIS — R69 Illness, unspecified: Secondary | ICD-10-CM | POA: Diagnosis not present

## 2016-12-25 DIAGNOSIS — I493 Ventricular premature depolarization: Secondary | ICD-10-CM | POA: Diagnosis present

## 2016-12-25 MED ORDER — FISH OIL 1200 MG PO CAPS: 1.0000 | ORAL_CAPSULE | ORAL | Status: DC

## 2016-12-25 MED ORDER — NITROGLYCERIN 0.4 MG SL SUBL: 0.4000 mg | SUBLINGUAL_TABLET | SUBLINGUAL | 1 refills | Status: DC | PRN

## 2016-12-25 NOTE — Progress Notes (Signed)
Pt & wife given discharge instructions. All questions were answered. Pt was given information on follow-up appointments, medications, activities, diet & site care. Hoover Brunette, RN

## 2016-12-25 NOTE — Discharge Summary (Signed)
Physician Discharge Summary  Patient ID: JADENCE BUTTERLY, MD MRN: [id number] DOB/AGE: October 26, 1946 71 y.o.  Admit date: 12/23/2016 Discharge date: 12/25/2016  Primary Discharge Diagnosis 1. Chest pain suggestive of unstable angina pectoris, probable coronary spasm  Serum troponin minimally elevated this morning.  Coronary angiogram 12/24/2016 revealing normal coronary arteries and normal LV systolic function.  Secondary Discharge Diagnosis 2.  Palpitations due to occasional PVCs. 3.  Hypertension 4.  Hyperlipidemia 5. Contrast allergy, premedicated prior to coronary angiography,no complications.  Significant Diagnostic Studies: Coronary angiogram 12/24/2016: Normal coronary arteries, right dominant circulation.  Hospital Course:  Patient admitted via emergency room visit after he presented with chest pain with radiation to his left jaw and left arm on 12/23/2016.  Associated marked dyspnea on exertion.  He was ruled out for myocardial infarction, underwent coronary angiography the following day, revealing normal coronary arteries.  He was kept overnight as it is late during the day and patient was sedated, and discharged the following morning.   Recommendations on discharge: In addition to his home medication, sublingual nitroglycerin was added.  For his palpitations, I advised him to try omega-3 fatty acid capsules.  I'll see him back in 2 weeks in the outpatient basis and make further recommendations.  Discharge Exam: Blood pressure (!) 116/52, pulse (!) 56, temperature 97.5 F (36.4 C), temperature source Oral, resp. rate 14, height 5\' 9"  (1.753 m), weight 190 lb 11.2 oz (86.5 kg), SpO2 99 %.   General appearance: alert, cooperative, appears stated age and no distress Resp: clear to auscultation bilaterally Chest wall: no tenderness Cardio: regular rate and rhythm, S1, S2 normal, no murmur, click, rub or gallop GI: soft, non-tender; bowel sounds normal; no masses,  no  organomegaly Extremities: extremities normal, atraumatic, no cyanosis or edema Pulses: 2+ and symmetric Neurologic: Grossly normal Incision/Wound: Right radial arterial access site has healed without any complications. Labs:   Lab Results  Component Value Date   WBC 7.4 12/24/2016   HGB 15.1 12/24/2016   HCT 42.0 12/24/2016   MCV 86.2 12/24/2016   PLT 263 12/24/2016    Recent Labs Lab 12/23/16 1800  NA 140  K 3.5  CL 108  CO2 25  BUN 9  CREATININE 0.96  CALCIUM 9.3  GLUCOSE 92    Lipid Panel     Component Value Date/Time   CHOL 135 12/24/2016 0401   TRIG 48 12/24/2016 0401   HDL 47 12/24/2016 0401   CHOLHDL 2.9 12/24/2016 0401   VLDL 10 12/24/2016 0401   LDLCALC 78 12/24/2016 0401     Recent Labs  12/23/16 2145 12/24/16 0401 12/24/16 1108  TROPONINI <0.03 <0.03 0.04*    Lab Results  Component Value Date   TROPONINI 0.04 ([place]) 12/24/2016    EKG  12/23/2016: Normal sinus rhythm, borderline criteria for left atrial abnormality, IVCD, atypical left bundle branch block.  Radiology: Dg Chest 2 View  Result Date: 12/23/2016 CLINICAL DATA:  Central chest tightness x2 days. History of asthma. Nonsmoker. EXAM: CHEST  2 VIEW COMPARISON:  10/13/2013 FINDINGS: The heart size and mediastinal contours are within normal limits. No aortic aneurysm. Both lungs are clear. ACDF of the visualized lower cervical spine. Degenerative change along the thoracic spine. IMPRESSION: No active cardiopulmonary disease. Electronically Signed   By: Ashley Royalty M.D.   On: 12/23/2016 18:42    FOLLOW UP PLANS AND APPOINTMENTS Discharge Instructions    Diet - low sodium heart healthy    Complete by:  As directed  Increase activity slowly    Complete by:  As directed      Allergies as of 12/25/2016      Reactions   Ivp Dye [iodinated Diagnostic Agents] Anaphylaxis, Swelling   Swelling of nose and throat   Flomax [tamsulosin Hcl]    Severe headache    Lipitor [atorvastatin]     Muscle pain    Zocor [simvastatin]    Muscle pain       Medication List    STOP taking these medications   ibuprofen 200 MG tablet Commonly known as:  ADVIL,MOTRIN     TAKE these medications   amLODipine 10 MG tablet Commonly known as:  NORVASC Take 10 mg by mouth daily.   aspirin 81 MG tablet Take 81 mg by mouth daily.   buPROPion 150 MG 24 hr tablet Commonly known as:  WELLBUTRIN XL Take 450 mg by mouth daily.   Fish Oil 1200 MG Caps Take 1 capsule (1,200 mg total) by mouth PC lunch.   folic acid Q000111Q MCG tablet Commonly known as:  FOLVITE Take 800 mcg by mouth daily.   HYDROcodone-acetaminophen 5-325 MG tablet Commonly known as:  NORCO/VICODIN Take 1-2 tablets by mouth every 4 (four) hours as needed for moderate pain or severe pain.   metoprolol succinate 50 MG 24 hr tablet Commonly known as:  TOPROL-XL Take 100 mg by mouth daily. Take with or immediately following a meal.   nitroGLYCERIN 0.4 MG SL tablet Commonly known as:  NITROSTAT Place 1 tablet (0.4 mg total) under the tongue every 5 (five) minutes x 3 doses as needed for chest pain.   ranitidine 150 MG tablet Commonly known as:  ZANTAC Take 300 mg by mouth daily.   rosuvastatin 10 MG tablet Commonly known as:  CRESTOR Take 10 mg by mouth every Monday, Wednesday, and Friday.   vitamin C 500 MG tablet Commonly known as:  ASCORBIC ACID Take 500 mg by mouth daily.   Vitamin D3 5000 units Tabs Take 5,000 Units by mouth daily.      Follow-up Information    Adrian Prows, MD Follow up on 01/10/2017.   Specialty:  Cardiology Why:  12 pm appointment Contact information: [address] [place] [address] [phone]          Adrian Prows, MD 12/25/2016, 9:14 AM  Pager: [phone] Office: [phone] If no answer: [phone]

## 2016-12-25 NOTE — Discharge Instructions (Signed)
Nonspecific Chest Pain °Chest pain can be caused by many different conditions. There is always a chance that your pain could be related to something serious, such as a heart attack or a blood clot in your lungs. Chest pain can also be caused by conditions that are not life-threatening. If you have chest pain, it is very important to follow up with your health care provider. °What are the causes? °Causes of this condition include: °· Heartburn. °· Pneumonia or bronchitis. °· Anxiety or stress. °· Inflammation around your heart (pericarditis) or lung (pleuritis or pleurisy). °· A blood clot in your lung. °· A collapsed lung (pneumothorax). This can develop suddenly on its own (spontaneous pneumothorax) or from trauma to the chest. °· Shingles infection (varicella-zoster virus). °· Heart attack. °· Damage to the bones, muscles, and cartilage that make up your chest wall. This can include: °? Bruised bones due to injury. °? Strained muscles or cartilage due to frequent or repeated coughing or overwork. °? Fracture to one or more ribs. °? Sore cartilage due to inflammation (costochondritis). ° °What increases the risk? °Risk factors for this condition may include: °· Activities that increase your risk for trauma or injury to your chest. °· Respiratory infections or conditions that cause frequent coughing. °· Medical conditions or overeating that can cause heartburn. °· Heart disease or family history of heart disease. °· Conditions or health behaviors that increase your risk of developing a blood clot. °· Having had chicken pox (varicella zoster). ° °What are the signs or symptoms? °Chest pain can feel like: °· Burning or tingling on the surface of your chest or deep in your chest. °· Crushing, pressure, aching, or squeezing pain. °· Dull or sharp pain that is worse when you move, cough, or take a deep breath. °· Pain that is also felt in your back, neck, shoulder, or arm, or pain that spreads to any of these  areas. ° °Your chest pain may come and go, or it may stay constant. °How is this diagnosed? °Lab tests or other studies may be needed to find the cause of your pain. Your health care provider may have you take a test called an ECG (electrocardiogram). An ECG records your heartbeat patterns at the time the test is performed. You may also have other tests, such as: °· Transthoracic echocardiogram (TTE). In this test, sound waves are used to create a picture of the heart structures and to look at how blood flows through your heart. °· Transesophageal echocardiogram (TEE). This is a more advanced imaging test that takes images from inside your body. It allows your health care provider to see your heart in finer detail. °· Cardiac monitoring. This allows your health care provider to monitor your heart rate and rhythm in real time. °· Holter monitor. This is a portable device that records your heartbeat and can help to diagnose abnormal heartbeats. It allows your health care provider to track your heart activity for several days, if needed. °· Stress tests. These can be done through exercise or by taking medicine that makes your heart beat more quickly. °· Blood tests. °· Other imaging tests. ° °How is this treated? °Treatment depends on what is causing your chest pain. Treatment may include: °· Medicines. These may include: °? Acid blockers for heartburn. °? Anti-inflammatory medicine. °? Pain medicine for inflammatory conditions. °? Antibiotic medicine, if an infection is present. °? Medicines to dissolve blood clots. °? Medicines to treat coronary artery disease (CAD). °· Supportive care for conditions that   do not require medicines. This may include: °? Resting. °? Applying heat or cold packs to injured areas. °? Limiting activities until pain decreases. ° °Follow these instructions at home: °Medicines °· If you were prescribed an antibiotic, take it as told by your health care provider. Do not stop taking the  antibiotic even if you start to feel better. °· Take over-the-counter and prescription medicines only as told by your health care provider. °Lifestyle °· Do not use any products that contain nicotine or tobacco, such as cigarettes and e-cigarettes. If you need help quitting, ask your health care provider. °· Do not drink alcohol. °· Make lifestyle changes as directed by your health care provider. These may include: °? Getting regular exercise. Ask your health care provider to suggest some activities that are safe for you. °? Eating a heart-healthy diet. A registered dietitian can help you to learn healthy eating options. °? Maintaining a healthy weight. °? Managing diabetes, if necessary. °? Reducing stress, such as with yoga or relaxation techniques. °General instructions °· Avoid any activities that bring on chest pain. °· If heartburn is the cause for your chest pain, raise (elevate) the head of your bed about 6 inches (15 cm) by putting blocks under the legs. Sleeping with more pillows does not effectively relieve heartburn because it only changes the position of your head. °· Keep all follow-up visits as told by your health care provider. This is important. This includes any further testing if your chest pain does not go away. °Contact a health care provider if: °· Your chest pain does not go away. °· You have a rash with blisters on your chest. °· You have a fever. °· You have chills. °Get help right away if: °· Your chest pain is worse. °· You have a cough that gets worse, or you cough up blood. °· You have severe pain in your abdomen. °· You have severe weakness. °· You faint. °· You have sudden, unexplained chest discomfort. °· You have sudden, unexplained discomfort in your arms, back, neck, or jaw. °· You have shortness of breath at any time. °· You suddenly start to sweat, or your skin gets clammy. °· You feel nauseous or you vomit. °· You suddenly feel light-headed or dizzy. °· Your heart begins to beat  quickly, or it feels like it is skipping beats. °These symptoms may represent a serious problem that is an emergency. Do not wait to see if the symptoms will go away. Get medical help right away. Call your local emergency services (911 in the [place] Do not drive yourself to the hospital. °This information is not intended to replace advice given to you by your health care provider. Make sure you discuss any questions you have with your health care provider. °Document Released: 08/07/2005 Document Revised: 07/22/2016 Document Reviewed: 07/22/2016 °Elsevier Interactive Patient Education © 2017 [employer] ° °

## 2017-01-10 DIAGNOSIS — R001 Bradycardia, unspecified: Secondary | ICD-10-CM | POA: Diagnosis not present

## 2017-01-10 DIAGNOSIS — R9431 Abnormal electrocardiogram [ECG] [EKG]: Secondary | ICD-10-CM | POA: Diagnosis not present

## 2017-01-10 DIAGNOSIS — I10 Essential (primary) hypertension: Secondary | ICD-10-CM | POA: Diagnosis not present

## 2017-01-10 DIAGNOSIS — I472 Ventricular tachycardia: Secondary | ICD-10-CM | POA: Diagnosis not present

## 2017-01-28 ENCOUNTER — Encounter (HOSPITAL_COMMUNITY): Payer: Self-pay | Admitting: *Deleted

## 2017-01-28 DIAGNOSIS — Z79899 Other long term (current) drug therapy: Secondary | ICD-10-CM | POA: Diagnosis not present

## 2017-01-28 DIAGNOSIS — F329 Major depressive disorder, single episode, unspecified: Secondary | ICD-10-CM | POA: Diagnosis not present

## 2017-01-28 DIAGNOSIS — N400 Enlarged prostate without lower urinary tract symptoms: Secondary | ICD-10-CM | POA: Diagnosis not present

## 2017-01-28 DIAGNOSIS — I10 Essential (primary) hypertension: Secondary | ICD-10-CM | POA: Diagnosis not present

## 2017-01-28 DIAGNOSIS — J45909 Unspecified asthma, uncomplicated: Secondary | ICD-10-CM | POA: Diagnosis not present

## 2017-01-28 DIAGNOSIS — Z980 Intestinal bypass and anastomosis status: Secondary | ICD-10-CM | POA: Diagnosis not present

## 2017-01-28 DIAGNOSIS — Z800 Family history of malignant neoplasm of digestive organs: Secondary | ICD-10-CM | POA: Diagnosis not present

## 2017-01-28 DIAGNOSIS — F4310 Post-traumatic stress disorder, unspecified: Secondary | ICD-10-CM | POA: Diagnosis not present

## 2017-01-28 DIAGNOSIS — Z1211 Encounter for screening for malignant neoplasm of colon: Secondary | ICD-10-CM | POA: Diagnosis not present

## 2017-01-28 DIAGNOSIS — Z7982 Long term (current) use of aspirin: Secondary | ICD-10-CM | POA: Diagnosis not present

## 2017-01-28 DIAGNOSIS — M1990 Unspecified osteoarthritis, unspecified site: Secondary | ICD-10-CM | POA: Diagnosis not present

## 2017-01-28 DIAGNOSIS — Z85828 Personal history of other malignant neoplasm of skin: Secondary | ICD-10-CM | POA: Diagnosis not present

## 2017-01-28 DIAGNOSIS — E7800 Pure hypercholesterolemia, unspecified: Secondary | ICD-10-CM | POA: Diagnosis not present

## 2017-01-28 DIAGNOSIS — K219 Gastro-esophageal reflux disease without esophagitis: Secondary | ICD-10-CM | POA: Diagnosis not present

## 2017-01-28 DIAGNOSIS — R69 Illness, unspecified: Secondary | ICD-10-CM | POA: Diagnosis not present

## 2017-01-30 ENCOUNTER — Encounter (HOSPITAL_COMMUNITY): Payer: Self-pay | Admitting: Gastroenterology

## 2017-01-30 ENCOUNTER — Ambulatory Visit (HOSPITAL_COMMUNITY): Payer: Medicare HMO | Admitting: Certified Registered Nurse Anesthetist

## 2017-01-30 ENCOUNTER — Ambulatory Visit (HOSPITAL_COMMUNITY)
Admission: RE | Admit: 2017-01-30 | Discharge: 2017-01-30 | Disposition: A | Discharge status: Home / Self Care | Payer: Medicare HMO | Source: Ambulatory Visit | Attending: Gastroenterology | Admitting: Gastroenterology

## 2017-01-30 ENCOUNTER — Encounter (HOSPITAL_COMMUNITY)
Admission: RE | Disposition: A | Discharge status: Home / Self Care | Payer: Self-pay | Source: Ambulatory Visit | Attending: Gastroenterology

## 2017-01-30 DIAGNOSIS — F329 Major depressive disorder, single episode, unspecified: Secondary | ICD-10-CM | POA: Insufficient documentation

## 2017-01-30 DIAGNOSIS — K219 Gastro-esophageal reflux disease without esophagitis: Secondary | ICD-10-CM | POA: Insufficient documentation

## 2017-01-30 DIAGNOSIS — I471 Supraventricular tachycardia: Secondary | ICD-10-CM | POA: Diagnosis not present

## 2017-01-30 DIAGNOSIS — I10 Essential (primary) hypertension: Secondary | ICD-10-CM | POA: Diagnosis not present

## 2017-01-30 DIAGNOSIS — M1990 Unspecified osteoarthritis, unspecified site: Secondary | ICD-10-CM | POA: Insufficient documentation

## 2017-01-30 DIAGNOSIS — K5720 Diverticulitis of large intestine with perforation and abscess without bleeding: Secondary | ICD-10-CM | POA: Diagnosis not present

## 2017-01-30 DIAGNOSIS — F4310 Post-traumatic stress disorder, unspecified: Secondary | ICD-10-CM | POA: Insufficient documentation

## 2017-01-30 DIAGNOSIS — N400 Enlarged prostate without lower urinary tract symptoms: Secondary | ICD-10-CM | POA: Diagnosis not present

## 2017-01-30 DIAGNOSIS — Z980 Intestinal bypass and anastomosis status: Secondary | ICD-10-CM | POA: Diagnosis not present

## 2017-01-30 DIAGNOSIS — Z1211 Encounter for screening for malignant neoplasm of colon: Secondary | ICD-10-CM | POA: Insufficient documentation

## 2017-01-30 DIAGNOSIS — R69 Illness, unspecified: Secondary | ICD-10-CM | POA: Diagnosis not present

## 2017-01-30 DIAGNOSIS — Z85828 Personal history of other malignant neoplasm of skin: Secondary | ICD-10-CM | POA: Insufficient documentation

## 2017-01-30 DIAGNOSIS — J45909 Unspecified asthma, uncomplicated: Secondary | ICD-10-CM | POA: Diagnosis not present

## 2017-01-30 DIAGNOSIS — Z800 Family history of malignant neoplasm of digestive organs: Secondary | ICD-10-CM | POA: Diagnosis not present

## 2017-01-30 DIAGNOSIS — Z7982 Long term (current) use of aspirin: Secondary | ICD-10-CM | POA: Insufficient documentation

## 2017-01-30 DIAGNOSIS — E7800 Pure hypercholesterolemia, unspecified: Secondary | ICD-10-CM | POA: Diagnosis not present

## 2017-01-30 DIAGNOSIS — Z79899 Other long term (current) drug therapy: Secondary | ICD-10-CM | POA: Insufficient documentation

## 2017-01-30 HISTORY — PX: COLONOSCOPY WITH PROPOFOL: SHX5780

## 2017-01-30 HISTORY — DX: Abnormal electrocardiogram (ECG) (EKG): R94.31

## 2017-01-30 LAB — HM COLONOSCOPY

## 2017-01-30 SURGERY — COLONOSCOPY WITH PROPOFOL
Anesthesia: Monitor Anesthesia Care

## 2017-01-30 MED ORDER — PROPOFOL 500 MG/50ML IV EMUL
INTRAVENOUS | Status: DC | PRN
  Administered 2017-01-30: 200 ug/kg/min via INTRAVENOUS

## 2017-01-30 MED ORDER — ONDANSETRON HCL 4 MG/2ML IJ SOLN
INTRAMUSCULAR | Status: DC | PRN
  Administered 2017-01-30: 4 mg via INTRAVENOUS

## 2017-01-30 MED ORDER — EPHEDRINE SULFATE 50 MG/ML IJ SOLN
INTRAMUSCULAR | Status: DC | PRN
  Administered 2017-01-30: 5 mg via INTRAVENOUS

## 2017-01-30 MED ORDER — LACTATED RINGERS IV SOLN
INTRAVENOUS | Status: DC
  Administered 2017-01-30: 10:00:00 via INTRAVENOUS

## 2017-01-30 MED ORDER — PROPOFOL 10 MG/ML IV BOLUS
INTRAVENOUS | Status: DC | PRN
  Administered 2017-01-30: 20 mg via INTRAVENOUS

## 2017-01-30 MED ORDER — ONDANSETRON HCL 4 MG/2ML IJ SOLN
INTRAMUSCULAR | Status: AC
  Filled 2017-01-30: qty 2

## 2017-01-30 MED ORDER — PROPOFOL 10 MG/ML IV BOLUS
INTRAVENOUS | Status: AC
  Filled 2017-01-30: qty 40

## 2017-01-30 MED ORDER — SODIUM CHLORIDE 0.9 % IV SOLN: INTRAVENOUS | Status: DC

## 2017-01-30 SURGICAL SUPPLY — 22 items

## 2017-01-30 NOTE — Transfer of Care (Signed)
Immediate Anesthesia Transfer of Care Note  Patient: Keith Beckmann, MD  Procedure(s) Performed: Procedure(s): COLONOSCOPY WITH PROPOFOL (N/A)  Patient Location: PACU  Anesthesia Type:MAC  Level of Consciousness:  sedated, patient cooperative and responds to stimulation  Airway & Oxygen Therapy:Patient Spontanous Breathing and Patient connected to face mask oxgen  Post-op Assessment:  Report given to PACU RN and Post -op Vital signs reviewed and stable  Post vital signs:  Reviewed and stable  Last Vitals:  Vitals:   01/30/17 1008  BP: (!) 141/70  Pulse: (!) 59  Resp: 16  Temp: 24.0 C    Complications: No apparent anesthesia complications

## 2017-01-30 NOTE — Op Note (Signed)
[facility] Patient Name: Keith [name] Procedure Date: 01/30/2017 MRN: [id number] Attending MD: Ronald Lobo , MD Date of Birth: 1946/03/29 CSN: [id number] Age: 71 Admit Type: Outpatient Procedure:                Colonoscopy Indications:              Screening in patient at increased risk: Colorectal                            cancer in father before age 87, Last colonoscopy:                            October 2012 Providers:                Ronald Lobo, MD, Elmer Ramp. Tilden Dome, RN, Corliss Parish, Technician Referring MD:              Medicines:                Monitored Anesthesia Care Complications:            No immediate complications. Estimated Blood Loss:     Estimated blood loss: none. Procedure:                Pre-Anesthesia Assessment:                           - Prior to the procedure, a History and Physical                            was performed, and patient medications and                            allergies were reviewed. The patient's tolerance of                            previous anesthesia was also reviewed. The risks                            and benefits of the procedure and the sedation                            options and risks were discussed with the patient.                            All questions were answered, and informed consent                            was obtained. Prior Anticoagulants: The patient has                            taken aspirin, last dose was 1 day prior to  procedure. ASA Grade Assessment: III - A patient                            with severe systemic disease. After reviewing the                            risks and benefits, the patient was deemed in                            satisfactory condition to undergo the procedure.                           After obtaining informed consent, the colonoscope                            was passed under direct vision.  Throughout the                            procedure, the patient's blood pressure, pulse, and                            oxygen saturations were monitored continuously. The                            EC-[id number] ([id number]) scope was introduced through                            the anus and advanced to the the cecum, identified                            by appendiceal orifice and ileocecal valve. The                            colonoscopy was performed with ease. The patient                            tolerated the procedure well. The quality of the                            bowel preparation was good; adherent film in the                            proximal colon was successfully irrigated off. The                            appendiceal orifice and the rectum were                            photographed. Scope In: 10:48:01 AM Scope Out: 11:15:09 AM Scope Withdrawal Time: 0 hours 23 minutes 53 seconds  Total Procedure Duration: 0 hours 27 minutes 8 seconds  Findings:      The digital rectal exam was normal. Pertinent negatives include normal       prostate (size, shape, and  consistency).      There was evidence of a prior end-to-end colo-colonic anastomosis at 18       cm proximal to the anus. This was patent and was characterized by       healthy appearing mucosa.      No other significant abnormalities were identified in a careful       examination of the remainder of the colon.      There is no endoscopic evidence of residual diverticula, inflammation,       mass or polyps in the entire colon.      The retroflexed view of the distal rectum and anal verge was normal and       showed no anal or rectal abnormalities. Impression:               - Patent end-to-end colo-colonic anastomosis,                            characterized by healthy appearing mucosa. No                            residual diverticulosis.                           - The distal rectum and anal verge are normal on                             retroflexion view.                           - No specimens collected. Moderate Sedation:      This patient was sedated with monitored anesthesia care, not moderate       sedation. Recommendation:           - Repeat colonoscopy in 5 years for screening                            purposes.                           - Resume previous diet.                           - Continue present medications. Procedure Code(s):        --- Professional ---                           Y6378, Colorectal cancer screening; colonoscopy on                            individual at high risk Diagnosis Code(s):        --- Professional ---                           Z80.0, Family history of malignant neoplasm of                            digestive organs CPT copyright 2016 American Medical Association. All rights reserved. The codes documented in this  report are preliminary and upon coder review may  be revised to meet current compliance requirements. Ronald Lobo, MD 01/30/2017 11:44:31 AM This report has been signed electronically. Number of Addenda: 0

## 2017-01-30 NOTE — Discharge Instructions (Signed)

## 2017-01-30 NOTE — Progress Notes (Signed)
Patient is completely awake and does not need to be monitored any longer. We're waiting on Dr. Cristina Gong to finish discharge instructions.

## 2017-01-30 NOTE — Anesthesia Postprocedure Evaluation (Addendum)
Anesthesia Post Note  Patient: Keith Beckmann, MD  Procedure(s) Performed: Procedure(s) (LRB): COLONOSCOPY WITH PROPOFOL (N/A)  Patient location during evaluation: Endoscopy Anesthesia Type: MAC Level of consciousness: awake and alert Pain management: pain level controlled Vital Signs Assessment: post-procedure vital signs reviewed and stable Respiratory status: spontaneous breathing, nonlabored ventilation, respiratory function stable and patient connected to nasal cannula oxygen Cardiovascular status: stable and blood pressure returned to baseline Anesthetic complications: no       Last Vitals:  Vitals:   01/30/17 1150 01/30/17 1200  BP: (!) 117/47 120/68  Pulse: (!) 57 (!) 49  Resp: 20 14  Temp:      Last Pain:  Vitals:   01/30/17 1122  TempSrc: Oral                 [name],[name]

## 2017-01-30 NOTE — Anesthesia Preprocedure Evaluation (Signed)
Anesthesia Evaluation  Patient identified by MRN, date of birth, ID band Patient awake    Reviewed: Allergy & Precautions, NPO status , Patient's Chart, lab work & pertinent test results  History of Anesthesia Complications (+) DIFFICULT AIRWAY  Airway Mallampati: III   Neck ROM: Limited    Dental  (+) Teeth Intact   Pulmonary asthma ,    breath sounds clear to auscultation       Cardiovascular hypertension, + dysrhythmias  Rhythm:Regular Rate:Bradycardia  pvcs   Neuro/Psych    GI/Hepatic GERD  ,  Endo/Other    Renal/GU      Musculoskeletal  (+) Arthritis ,   Abdominal   Peds  Hematology   Anesthesia Other Findings   Reproductive/Obstetrics                             Anesthesia Physical Anesthesia Plan  ASA: III  Anesthesia Plan: MAC   Post-op Pain Management:    Induction: Intravenous  Airway Management Planned: Simple Face Mask and Natural Airway  Additional Equipment:   Intra-op Plan:   Post-operative Plan:   Informed Consent: I have reviewed the patients History and Physical, chart, labs and discussed the procedure including the risks, benefits and alternatives for the proposed anesthesia with the patient or authorized representative who has indicated his/her understanding and acceptance.   Dental advisory given  Plan Discussed with:   Anesthesia Plan Comments:         Anesthesia Quick Evaluation
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
## 2017-02-02 ENCOUNTER — Encounter (HOSPITAL_COMMUNITY): Payer: Self-pay | Admitting: Gastroenterology

## 2017-02-24 DIAGNOSIS — L570 Actinic keratosis: Secondary | ICD-10-CM | POA: Diagnosis not present

## 2017-02-24 DIAGNOSIS — D229 Melanocytic nevi, unspecified: Secondary | ICD-10-CM | POA: Diagnosis not present

## 2017-02-24 DIAGNOSIS — L821 Other seborrheic keratosis: Secondary | ICD-10-CM | POA: Diagnosis not present

## 2017-04-11 NOTE — Addendum Note (Signed)
Addendum  created 04/11/17 1243 by Rica Koyanagi, MD   Sign clinical note

## 2017-06-12 ENCOUNTER — Ambulatory Visit (INDEPENDENT_AMBULATORY_CARE_PROVIDER_SITE_OTHER): Payer: Medicare HMO | Admitting: Family Medicine

## 2017-06-12 ENCOUNTER — Encounter: Payer: Self-pay | Admitting: Family Medicine

## 2017-06-12 VITALS — BP 140/62 | HR 51 | Temp 98.5°F | Ht 69.0 in | Wt 201.2 lb

## 2017-06-12 DIAGNOSIS — I472 Ventricular tachycardia: Secondary | ICD-10-CM

## 2017-06-12 DIAGNOSIS — N400 Enlarged prostate without lower urinary tract symptoms: Secondary | ICD-10-CM

## 2017-06-12 DIAGNOSIS — E785 Hyperlipidemia, unspecified: Secondary | ICD-10-CM | POA: Diagnosis not present

## 2017-06-12 DIAGNOSIS — I10 Essential (primary) hypertension: Secondary | ICD-10-CM

## 2017-06-12 DIAGNOSIS — I4729 Other ventricular tachycardia: Secondary | ICD-10-CM

## 2017-06-12 DIAGNOSIS — Z7189 Other specified counseling: Secondary | ICD-10-CM

## 2017-06-12 NOTE — Patient Instructions (Addendum)
I'll check back/update your records.  Don't change your meds.   Update me as needed.  Take care.  Glad to see you.

## 2017-06-13 ENCOUNTER — Encounter: Payer: Self-pay | Admitting: Family Medicine

## 2017-06-13 DIAGNOSIS — E785 Hyperlipidemia, unspecified: Secondary | ICD-10-CM | POA: Insufficient documentation

## 2017-06-13 DIAGNOSIS — Z7189 Other specified counseling: Secondary | ICD-10-CM | POA: Insufficient documentation

## 2017-06-13 DIAGNOSIS — I472 Ventricular tachycardia: Secondary | ICD-10-CM | POA: Insufficient documentation

## 2017-06-13 DIAGNOSIS — I4729 Other ventricular tachycardia: Secondary | ICD-10-CM | POA: Insufficient documentation

## 2017-06-13 DIAGNOSIS — N400 Enlarged prostate without lower urinary tract symptoms: Secondary | ICD-10-CM | POA: Insufficient documentation

## 2017-06-13 NOTE — Assessment & Plan Note (Addendum)
Noted on ekg prev. suppressed with beta blocker. no recurrent symptoms. he has seen cardiology. >30 minutes spent in face to face time with patient, >50% spent in counselling or coordination of care, discussing his cardiac history along with hypertension, hyperlipidemia, prostate biopsy, baseline chronic medical conditions, etc.

## 2017-06-13 NOTE — Assessment & Plan Note (Signed)
Able to tolerate Crestor on Monday Wednesday Friday. Continue as is. He agrees.

## 2017-06-13 NOTE — Assessment & Plan Note (Signed)
Advance directive discussed with patient. Daughter Tye [place] designated if patient were incapacitated.

## 2017-06-13 NOTE — Assessment & Plan Note (Signed)
With negative prostate biopsy previously. Continue finasteride. Symptoms improved on finasteride. He agrees.

## 2017-06-13 NOTE — Assessment & Plan Note (Addendum)
Controlled. Continue current medication. No adverse effect. He agrees. Diet and exercise d/w pt.

## 2017-06-13 NOTE — Progress Notes (Signed)
New patient to establish care, requesting records.  Hypertension:    Using medication without problems or lightheadedness: yes Chest pain with exertion:no Edema:no Short of breath:no  Elevated Cholesterol: Using medications without problems:yes Muscle aches: not with MWF dosing of statin Diet compliance: encouraged Exercise: encouraged  BPH.  He had some urinary hesitancy and no dysuria. Improved on med. Compliant. No adverse effect on medication. Status post negative prostate biopsy.  VT.  Previously noted on EKG, episodic nonsustained ventricular tachycardia. Symptoms progressed with beta blocker in the meantime. No recurrent symptoms now. Has seen cardiology.  Advance directive discussed with patient. Daughter Tye [place] designated if patient were incapacitated.  Meds, vitals, and allergies reviewed.   PMH and SH reviewed  ROS: Per HPI unless specifically indicated in ROS section   GEN: nad, alert and oriented HEENT: mucous membranes moist NECK: supple w/o LA CV: rrr. PULM: ctab, no inc wob ABD: soft, +bs EXT: no edema

## 2017-07-21 DIAGNOSIS — I10 Essential (primary) hypertension: Secondary | ICD-10-CM | POA: Diagnosis not present

## 2017-07-21 DIAGNOSIS — R001 Bradycardia, unspecified: Secondary | ICD-10-CM | POA: Diagnosis not present

## 2017-07-21 DIAGNOSIS — I472 Ventricular tachycardia: Secondary | ICD-10-CM | POA: Diagnosis not present

## 2017-08-10 ENCOUNTER — Encounter: Payer: Self-pay | Admitting: Family Medicine

## 2017-08-10 DIAGNOSIS — S069X9A Unspecified intracranial injury with loss of consciousness of unspecified duration, initial encounter: Secondary | ICD-10-CM | POA: Insufficient documentation

## 2017-08-10 DIAGNOSIS — F4310 Post-traumatic stress disorder, unspecified: Secondary | ICD-10-CM | POA: Insufficient documentation

## 2017-08-10 DIAGNOSIS — S069XAA Unspecified intracranial injury with loss of consciousness status unknown, initial encounter: Secondary | ICD-10-CM | POA: Insufficient documentation

## 2017-08-12 ENCOUNTER — Encounter: Payer: Self-pay | Admitting: Family Medicine

## 2017-08-12 LAB — LAB REPORT - SCANNED
PROSTATE SPECIFIC AG, SERUM: 5.11
Prostate Specific Ag, Serum: 4.64

## 2017-12-22 ENCOUNTER — Other Ambulatory Visit: Payer: Self-pay | Admitting: Dermatology

## 2017-12-22 DIAGNOSIS — L820 Inflamed seborrheic keratosis: Secondary | ICD-10-CM | POA: Diagnosis not present

## 2017-12-22 DIAGNOSIS — D485 Neoplasm of uncertain behavior of skin: Secondary | ICD-10-CM | POA: Diagnosis not present

## 2017-12-22 DIAGNOSIS — L570 Actinic keratosis: Secondary | ICD-10-CM | POA: Diagnosis not present

## 2018-01-26 DIAGNOSIS — R001 Bradycardia, unspecified: Secondary | ICD-10-CM | POA: Diagnosis not present

## 2018-01-26 DIAGNOSIS — I10 Essential (primary) hypertension: Secondary | ICD-10-CM | POA: Diagnosis not present

## 2018-01-26 DIAGNOSIS — I472 Ventricular tachycardia: Secondary | ICD-10-CM | POA: Diagnosis not present

## 2018-01-28 DIAGNOSIS — I10 Essential (primary) hypertension: Secondary | ICD-10-CM | POA: Diagnosis not present

## 2018-01-28 DIAGNOSIS — R001 Bradycardia, unspecified: Secondary | ICD-10-CM | POA: Diagnosis not present

## 2018-01-28 DIAGNOSIS — E7800 Pure hypercholesterolemia, unspecified: Secondary | ICD-10-CM | POA: Diagnosis not present

## 2018-03-16 ENCOUNTER — Other Ambulatory Visit: Payer: Self-pay | Admitting: *Deleted

## 2018-03-16 DIAGNOSIS — R972 Elevated prostate specific antigen [PSA]: Secondary | ICD-10-CM | POA: Diagnosis not present

## 2018-03-16 MED ORDER — BUPROPION HCL ER (XL) 150 MG PO TB24: 450.0000 mg | ORAL_TABLET | Freq: Every day | ORAL | 1 refills | Status: DC

## 2018-03-24 ENCOUNTER — Other Ambulatory Visit: Payer: Self-pay | Admitting: Family Medicine

## 2018-03-24 NOTE — Telephone Encounter (Signed)
Copied from [place] [id number]. Topic: Quick Communication - Rx Refill/Question >> Mar 24, 2018  2:59 PM Pricilla Handler wrote: Medication: BuPROPion (WELLBUTRIN XL) 150 MG 24 hr tablet Has the patient contacted their pharmacy? Yes.   (Agent: If no, request that the patient contact the pharmacy for the refill.) Preferred Pharmacy (with phone number or street name): [facility] [address], [place] - [address] [phone] (Phone) [phone] (Fax)  Agent: Please be advised that RX refills may take up to 3 business days. We ask that you follow-up with your pharmacy.

## 2018-03-24 NOTE — Telephone Encounter (Signed)
Copied from [place] [id number]. Topic: Quick Communication - Rx Refill/Question >> Mar 24, 2018  2:59 PM Pricilla Handler wrote: Medication: BuPROPion (WELLBUTRIN XL) 150 MG 24 hr tablet Has the patient contacted their pharmacy? Yes.   (Agent: If no, request that the patient contact the pharmacy for the refill.) Preferred Pharmacy (with phone number or street name): [facility] [address] [place] - [address] [phone] (Phone) [phone] (Fax)  Agent: Please be advised that RX refills may take up to 3 business days. We ask that you follow-up with your pharmacy.

## 2018-03-24 NOTE — Telephone Encounter (Signed)
Spoke to [place] at [place] and received prior authorization approval effective 11/11/2017 until 11/10/2018. Spoke to [place] at pharmacy and advised her that medication has been approved per Mechele Claude at [place] until 11/10/2018.

## 2018-03-24 NOTE — Telephone Encounter (Signed)
I spoke with Guya at [facility]; the bupropion refill was received but requires PA. Berneta Levins will refax PA request to [phone]. Berneta Levins said need to call [phone] for PA.

## 2018-03-25 DIAGNOSIS — R351 Nocturia: Secondary | ICD-10-CM | POA: Diagnosis not present

## 2018-03-25 DIAGNOSIS — R972 Elevated prostate specific antigen [PSA]: Secondary | ICD-10-CM | POA: Diagnosis not present

## 2018-03-25 DIAGNOSIS — N401 Enlarged prostate with lower urinary tract symptoms: Secondary | ICD-10-CM | POA: Diagnosis not present

## 2018-03-30 ENCOUNTER — Encounter: Payer: Self-pay | Admitting: Family Medicine

## 2018-03-30 ENCOUNTER — Ambulatory Visit (INDEPENDENT_AMBULATORY_CARE_PROVIDER_SITE_OTHER): Payer: Medicare HMO | Admitting: Family Medicine

## 2018-03-30 DIAGNOSIS — H2513 Age-related nuclear cataract, bilateral: Secondary | ICD-10-CM | POA: Diagnosis not present

## 2018-03-30 DIAGNOSIS — I10 Essential (primary) hypertension: Secondary | ICD-10-CM

## 2018-03-30 DIAGNOSIS — I4729 Other ventricular tachycardia: Secondary | ICD-10-CM

## 2018-03-30 DIAGNOSIS — E785 Hyperlipidemia, unspecified: Secondary | ICD-10-CM | POA: Diagnosis not present

## 2018-03-30 DIAGNOSIS — I472 Ventricular tachycardia: Secondary | ICD-10-CM

## 2018-03-30 DIAGNOSIS — F4310 Post-traumatic stress disorder, unspecified: Secondary | ICD-10-CM | POA: Diagnosis not present

## 2018-03-30 DIAGNOSIS — R69 Illness, unspecified: Secondary | ICD-10-CM | POA: Diagnosis not present

## 2018-03-30 NOTE — Patient Instructions (Signed)
Don't change your meds for now.  I'll update your records.   Update me as needed.  Take care.  Glad to see you.

## 2018-03-30 NOTE — Progress Notes (Signed)
He had memory changes on crestor and stopped crestor.  He felt better off the medicine.  He did not have obstructive coronary artery disease that there was no compelling reason to continue medication.  Discussed.  Mood d/w pt. Still on wellbutrin.  At baseline.  No SI/HI.  Medication is help.  Mood is at baseline.  His sig other is going through breast cancer treatment.  We talked about his role as a caregiver.  Hypertension:    Using medication without problems or lightheadedness: yes Chest pain with exertion:no Edema:no Short of breath:no  History of nonsustained ventricular tachycardia followed by cardiology currently treated with beta-blocker without known return of symptoms in the meantime.  No chest pain.  Not short of breath.  Recent PSA done by urology.  1.18.  On finasteride.  Discussed with patient about correction/doubling PSA value given that he is on finasteride.  Either way this would still be a normal "corrected" value of approximately 2.4.  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  He enjoys the work and provides a good service to the patient's.  PMH and SH reviewed  ROS: Per HPI unless specifically indicated in ROS section   Meds, vitals, and allergies reviewed.   GEN: nad, alert and oriented HEENT: mucous membranes moist NECK: supple w/o LA CV: rrr.  no murmur PULM: ctab, no inc wob ABD: soft, +bs EXT: trace almost no BLE edema SKIN: well perfused.

## 2018-04-01 NOTE — Assessment & Plan Note (Signed)
He did not feel well on Crestor and had memory changes noted.  He felt better off medication.  He did not have a compelling reason to continue with statin so it was reasonable to stop.  Discussed with patient.

## 2018-04-01 NOTE — Assessment & Plan Note (Signed)
His mood is good.  No adverse effect on medication.  Continue as is.  He agrees.  Update me as needed.

## 2018-04-01 NOTE — Assessment & Plan Note (Addendum)
No change in current medications.  Reasonable control.  No chest pain.  Update me as needed.  He agrees. >25 minutes spent in face to face time with patient, >50% spent in counselling or coordination of care, discussing hypertension, mood, history of nonsustained ventricular tachycardia, etc.

## 2018-04-01 NOTE — Assessment & Plan Note (Signed)
Sounds to be regular.  No known episodes of palpitations that he can detect.  Continue beta-blocker.  Followed by cardiology.

## 2018-04-29 DIAGNOSIS — R69 Illness, unspecified: Secondary | ICD-10-CM | POA: Diagnosis not present

## 2018-05-18 ENCOUNTER — Encounter: Payer: Self-pay | Admitting: Family Medicine

## 2018-05-22 ENCOUNTER — Encounter: Payer: Self-pay | Admitting: Family Medicine

## 2018-05-22 LAB — LAB REPORT - SCANNED
ALT: 14
AST: 24
CREATININE: 1.06
Cholesterol: 166
Glucose: 93
HDL: 49
HEMOGLOBIN: 15.4
LDL (calc): 103
POTASSIUM: 5.1
TSH: 1.63
Triglycerides: 69

## 2018-06-02 DIAGNOSIS — R69 Illness, unspecified: Secondary | ICD-10-CM | POA: Diagnosis not present

## 2018-07-28 ENCOUNTER — Other Ambulatory Visit: Payer: Self-pay | Admitting: Gastroenterology

## 2018-07-28 DIAGNOSIS — R1319 Other dysphagia: Secondary | ICD-10-CM

## 2018-07-28 DIAGNOSIS — K219 Gastro-esophageal reflux disease without esophagitis: Secondary | ICD-10-CM | POA: Diagnosis not present

## 2018-07-28 DIAGNOSIS — R1310 Dysphagia, unspecified: Secondary | ICD-10-CM | POA: Diagnosis not present

## 2018-08-03 DIAGNOSIS — R001 Bradycardia, unspecified: Secondary | ICD-10-CM | POA: Diagnosis not present

## 2018-08-03 DIAGNOSIS — Z0189 Encounter for other specified special examinations: Secondary | ICD-10-CM | POA: Diagnosis not present

## 2018-08-03 DIAGNOSIS — I10 Essential (primary) hypertension: Secondary | ICD-10-CM | POA: Diagnosis not present

## 2018-08-03 DIAGNOSIS — I472 Ventricular tachycardia: Secondary | ICD-10-CM | POA: Diagnosis not present

## 2018-08-04 ENCOUNTER — Other Ambulatory Visit: Payer: Self-pay | Admitting: *Deleted

## 2018-08-04 NOTE — Telephone Encounter (Signed)
Received faxed refill request that Dr. Damita Dunnings send new scripts over for Amlodipine, Metoprolol and Ranitidine. These scripts were filled by a previous doctor.

## 2018-08-05 MED ORDER — AMLODIPINE BESYLATE 10 MG PO TABS: 10.0000 mg | ORAL_TABLET | Freq: Every day | ORAL | 3 refills | Status: DC

## 2018-08-05 MED ORDER — RANITIDINE HCL 150 MG PO TABS: 300.0000 mg | ORAL_TABLET | Freq: Every day | ORAL | 3 refills | Status: DC

## 2018-08-05 MED ORDER — METOPROLOL SUCCINATE ER 50 MG PO TB24: 100.0000 mg | ORAL_TABLET | Freq: Every day | ORAL | 3 refills | Status: DC

## 2018-08-05 NOTE — Telephone Encounter (Signed)
Sent. Thanks.   

## 2018-08-06 ENCOUNTER — Ambulatory Visit
Admission: RE | Admit: 2018-08-06 | Discharge: 2018-08-06 | Disposition: A | Discharge status: Home / Self Care | Payer: Medicare HMO | Source: Ambulatory Visit | Attending: Gastroenterology | Admitting: Gastroenterology

## 2018-08-06 DIAGNOSIS — R1319 Other dysphagia: Secondary | ICD-10-CM

## 2018-08-06 DIAGNOSIS — R1310 Dysphagia, unspecified: Secondary | ICD-10-CM

## 2018-08-06 DIAGNOSIS — K449 Diaphragmatic hernia without obstruction or gangrene: Secondary | ICD-10-CM | POA: Diagnosis not present

## 2018-09-01 DIAGNOSIS — R1310 Dysphagia, unspecified: Secondary | ICD-10-CM | POA: Diagnosis not present

## 2018-09-01 DIAGNOSIS — K219 Gastro-esophageal reflux disease without esophagitis: Secondary | ICD-10-CM | POA: Diagnosis not present

## 2018-10-05 ENCOUNTER — Encounter: Payer: Self-pay | Admitting: Family Medicine

## 2018-10-05 ENCOUNTER — Ambulatory Visit (INDEPENDENT_AMBULATORY_CARE_PROVIDER_SITE_OTHER): Payer: Medicare HMO | Admitting: Family Medicine

## 2018-10-05 VITALS — BP 120/68 | HR 47 | Temp 98.5°F | Ht 69.0 in | Wt 201.5 lb

## 2018-10-05 DIAGNOSIS — I10 Essential (primary) hypertension: Secondary | ICD-10-CM | POA: Diagnosis not present

## 2018-10-05 NOTE — Progress Notes (Signed)
Hypertension:    Using medication without problems or lightheadedness: yes Chest pain with exertion:no Edema: occ, mild BLE, improved overnight Short of breath: no  He is tolerating relative bradycardia, followed by cardiology.   D/w pt.    Flu shot done at work.    We talked about his work and family situation.  One of his step children has ongoing concerns, d/w pt.  His sig other Manuela Schwartz went through breast cancer treatment.    He has seen GI about prev dysphagia.  He changed to pepcid in the meantime with no severe changes on barium swallow.    He will f/u with urology and I'll defer on PSA.  He agrees.  Meds, vitals, and allergies reviewed.   PMH and SH reviewed ROS: Per HPI unless specifically indicated in ROS section   GEN: nad, alert and oriented HEENT: mucous membranes moist NECK: supple w/o LA CV: rrr. PULM: ctab, no inc wob ABD: soft, +bs EXT: no edema SKIN: no acute rash

## 2018-10-05 NOTE — Patient Instructions (Addendum)
Recheck in about 6 months at a yearly medicare wellness visit.   Take care.  Glad to see you.  CMET/CBC when possible.  Update me as needed.

## 2018-10-10 NOTE — Assessment & Plan Note (Signed)
Reasonable control.  He is tolerating relative bradycardia.  He is followed by cardiology.  Continue as is.  Reasonable to recheck labs when possible.  I wrote an order for CMET/CBC.  He can get those drawn.  Continue as is otherwise.  Update me as needed.

## 2018-11-26 DIAGNOSIS — R69 Illness, unspecified: Secondary | ICD-10-CM | POA: Diagnosis not present

## 2018-12-22 ENCOUNTER — Other Ambulatory Visit: Payer: Self-pay | Admitting: Family Medicine

## 2019-02-01 ENCOUNTER — Encounter: Payer: Self-pay | Admitting: Cardiology

## 2019-02-01 ENCOUNTER — Ambulatory Visit: Payer: Self-pay | Admitting: Cardiology

## 2019-02-01 ENCOUNTER — Other Ambulatory Visit: Payer: Self-pay

## 2019-02-01 ENCOUNTER — Ambulatory Visit: Payer: Medicare HMO | Admitting: Cardiology

## 2019-02-01 VITALS — BP 145/66 | HR 50 | Ht 70.0 in | Wt 196.3 lb

## 2019-02-01 DIAGNOSIS — I4729 Other ventricular tachycardia: Secondary | ICD-10-CM

## 2019-02-01 DIAGNOSIS — I472 Ventricular tachycardia: Secondary | ICD-10-CM | POA: Diagnosis not present

## 2019-02-01 DIAGNOSIS — E7800 Pure hypercholesterolemia, unspecified: Secondary | ICD-10-CM

## 2019-02-01 DIAGNOSIS — I10 Essential (primary) hypertension: Secondary | ICD-10-CM

## 2019-02-03 ENCOUNTER — Ambulatory Visit: Payer: Self-pay | Admitting: Cardiology
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
## 2019-03-16 DIAGNOSIS — J029 Acute pharyngitis, unspecified: Secondary | ICD-10-CM | POA: Insufficient documentation

## 2019-03-16 DIAGNOSIS — R1313 Dysphagia, pharyngeal phase: Secondary | ICD-10-CM | POA: Diagnosis not present

## 2019-03-16 DIAGNOSIS — K219 Gastro-esophageal reflux disease without esophagitis: Secondary | ICD-10-CM | POA: Diagnosis not present

## 2019-03-18 ENCOUNTER — Other Ambulatory Visit: Payer: Self-pay | Admitting: Family Medicine

## 2019-03-22 DIAGNOSIS — R972 Elevated prostate specific antigen [PSA]: Secondary | ICD-10-CM | POA: Diagnosis not present

## 2019-03-31 DIAGNOSIS — N401 Enlarged prostate with lower urinary tract symptoms: Secondary | ICD-10-CM | POA: Diagnosis not present

## 2019-03-31 DIAGNOSIS — R351 Nocturia: Secondary | ICD-10-CM | POA: Diagnosis not present

## 2019-03-31 DIAGNOSIS — R972 Elevated prostate specific antigen [PSA]: Secondary | ICD-10-CM | POA: Diagnosis not present

## 2019-04-22 DIAGNOSIS — I10 Essential (primary) hypertension: Secondary | ICD-10-CM | POA: Diagnosis not present

## 2019-04-22 DIAGNOSIS — E7800 Pure hypercholesterolemia, unspecified: Secondary | ICD-10-CM | POA: Diagnosis not present

## 2019-04-23 LAB — CBC
HEMOGLOBIN: 15.3 g/dL (ref 13.0–17.7)
Hematocrit: 44.6 % (ref 37.5–51.0)
MCH: 30.3 pg (ref 26.6–33.0)
MCHC: 34.3 g/dL (ref 31.5–35.7)
MCV: 88 fL (ref 79–97)
PLATELETS: 294 10*3/uL (ref 150–450)
RBC: 5.05 x10E6/uL (ref 4.14–5.80)
RDW: 12.6 % (ref 11.6–15.4)
WBC: 6.5 10*3/uL (ref 3.4–10.8)

## 2019-04-23 LAB — TSH: TSH: 1.3 u[IU]/mL (ref 0.450–4.500)

## 2019-04-23 LAB — LIPID PANEL
Chol/HDL Ratio: 3.4 ratio (ref 0.0–5.0)
Cholesterol, Total: 163 mg/dL (ref 100–199)
HDL: 48 mg/dL (ref >39)
LDL CALC: 104 mg/dL — AB (ref 0–99)
TRIGLYCERIDES: 55 mg/dL (ref 0–149)
VLDL CHOLESTEROL CAL: 11 mg/dL (ref 5–40)

## 2019-04-23 LAB — COMPREHENSIVE METABOLIC PANEL
A/G RATIO: 1.7 (ref 1.2–2.2)
ALK PHOS: 57 IU/L (ref 39–117)
ALT: 12 IU/L (ref 0–44)
AST: 21 IU/L (ref 0–40)
Albumin: 4.2 g/dL (ref 3.7–4.7)
BUN/Creatinine Ratio: 13 (ref 10–24)
BUN: 13 mg/dL (ref 8–27)
Bilirubin Total: 0.7 mg/dL (ref 0.0–1.2)
CO2: 25 mmol/L (ref 20–29)
CREATININE: 1.03 mg/dL (ref 0.76–1.27)
Calcium: 9.4 mg/dL (ref 8.6–10.2)
Chloride: 104 mmol/L (ref 96–106)
GFR calc Af Amer: 83 mL/min/{1.73_m2} (ref >59)
GFR, EST NON AFRICAN AMERICAN: 72 mL/min/{1.73_m2} (ref >59)
GLOBULIN, TOTAL: 2.5 g/dL (ref 1.5–4.5)
GLUCOSE: 98 mg/dL (ref 65–99)
POTASSIUM: 5.2 mmol/L (ref 3.5–5.2)
Sodium: 139 mmol/L (ref 134–144)
Total Protein: 6.7 g/dL (ref 6.0–8.5)

## 2019-04-29 ENCOUNTER — Ambulatory Visit: Payer: Medicare HMO

## 2019-05-03 ENCOUNTER — Other Ambulatory Visit: Payer: Self-pay

## 2019-05-03 ENCOUNTER — Encounter: Payer: Self-pay | Admitting: Family Medicine

## 2019-05-03 ENCOUNTER — Ambulatory Visit (INDEPENDENT_AMBULATORY_CARE_PROVIDER_SITE_OTHER): Payer: Medicare HMO | Admitting: Family Medicine

## 2019-05-03 VITALS — BP 130/66 | HR 54 | Temp 98.1°F | Ht 70.0 in | Wt 193.3 lb

## 2019-05-03 DIAGNOSIS — E7800 Pure hypercholesterolemia, unspecified: Secondary | ICD-10-CM | POA: Diagnosis not present

## 2019-05-03 DIAGNOSIS — I10 Essential (primary) hypertension: Secondary | ICD-10-CM

## 2019-05-03 DIAGNOSIS — Z0000 Encounter for general adult medical examination without abnormal findings: Secondary | ICD-10-CM

## 2019-05-03 DIAGNOSIS — Z7189 Other specified counseling: Secondary | ICD-10-CM

## 2019-05-03 DIAGNOSIS — K219 Gastro-esophageal reflux disease without esophagitis: Secondary | ICD-10-CM

## 2019-05-03 DIAGNOSIS — R4586 Emotional lability: Secondary | ICD-10-CM | POA: Diagnosis not present

## 2019-05-03 DIAGNOSIS — N400 Enlarged prostate without lower urinary tract symptoms: Secondary | ICD-10-CM

## 2019-05-03 DIAGNOSIS — R69 Illness, unspecified: Secondary | ICD-10-CM | POA: Diagnosis not present

## 2019-05-03 MED ORDER — BUPROPION HCL ER (XL) 150 MG PO TB24
450.0000 mg | ORAL_TABLET | Freq: Every day | ORAL | 3 refills | Status: DC
Start: 2019-05-03 — End: 2020-06-22

## 2019-05-03 MED ORDER — METOPROLOL SUCCINATE ER 50 MG PO TB24: 100.0000 mg | ORAL_TABLET | Freq: Every day | ORAL | 3 refills | Status: DC

## 2019-05-03 MED ORDER — AMLODIPINE BESYLATE 10 MG PO TABS: 10.0000 mg | ORAL_TABLET | Freq: Every day | ORAL | 3 refills | Status: DC

## 2019-05-03 MED ORDER — ROSUVASTATIN CALCIUM 10 MG PO TABS: 10.0000 mg | ORAL_TABLET | ORAL | 3 refills | Status: DC

## 2019-05-03 NOTE — Patient Instructions (Signed)
Start crestor twice a week and recheck labs in about 2 months.   Update me as needed.  Take care.  Glad to see you.
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
## 2019-05-06 DIAGNOSIS — Z0000 Encounter for general adult medical examination without abnormal findings: Secondary | ICD-10-CM | POA: Insufficient documentation

## 2019-05-06 DIAGNOSIS — R4586 Emotional lability: Secondary | ICD-10-CM | POA: Insufficient documentation

## 2019-05-06 NOTE — Assessment & Plan Note (Addendum)
Per urology.  I will defer. ?

## 2019-05-06 NOTE — Assessment & Plan Note (Signed)
Flu 2019 Shingles 2008 PNA up-to-date Tetanus 2013 Colon 2018 via equal GI Prostate cancer screening per urology.  I will defer.  He agrees. Advance directive- Advance directive discussed with patient. Daughter Keith [name] designated if patient were incapacitated. Cognitive function addressed- see scanned forms- and if abnormal then additional documentation follows.

## 2019-05-06 NOTE — Assessment & Plan Note (Signed)
He took prilosec for ST, ST resolved, with the plan for change back to pepcid. D/w pt.   likely that he had reflux causing sore throat.

## 2019-05-06 NOTE — Assessment & Plan Note (Signed)
He will try intermittent dosing of Crestor and I gave him a lab slip to recheck lipid and liver panel in about 2 months.  He will update me as needed.

## 2019-05-06 NOTE — Assessment & Plan Note (Signed)
Reasonable control.  No change in meds.  He agrees.  Previous labs discussed with patient.

## 2019-05-06 NOTE — Assessment & Plan Note (Signed)
Mood d/w pt.  He has tolerated med and no ADE as is.  Compliant.  No SI/HI.

## 2019-05-06 NOTE — Assessment & Plan Note (Signed)
Advance directive- Advance directive discussed with patient. Daughter Tye Maryland designated if patient were incapacitated.

## 2019-05-17 DIAGNOSIS — R69 Illness, unspecified: Secondary | ICD-10-CM | POA: Diagnosis not present

## 2019-05-26 ENCOUNTER — Telehealth: Payer: Self-pay

## 2019-05-26 NOTE — Telephone Encounter (Signed)
Pt has been given Viagra 100mg  in the past for ED. He would like a new rx for sildenafil 100mg  sent to [place]. He has not used nitro in over 2 years. He does not use it or has not needed it.

## 2019-05-27 MED ORDER — SILDENAFIL CITRATE 20 MG PO TABS: 100.0000 mg | ORAL_TABLET | Freq: Every day | ORAL | 12 refills | Status: DC | PRN

## 2019-05-27 NOTE — Telephone Encounter (Signed)
Thanks for checking on the nitroglycerin cautions.  Please let him know that I sent the 20 mg pills as that is usually a lot cheaper than the 100 mg.  If I need to resend it as the 100 mg tablets then let me know.  Thanks.

## 2019-05-27 NOTE — Telephone Encounter (Signed)
Patient advised.

## 2019-06-03 DIAGNOSIS — R69 Illness, unspecified: Secondary | ICD-10-CM | POA: Diagnosis not present

## 2019-06-22 ENCOUNTER — Other Ambulatory Visit: Payer: Self-pay | Admitting: Family Medicine

## 2019-09-07 DIAGNOSIS — Z20828 Contact with and (suspected) exposure to other viral communicable diseases: Secondary | ICD-10-CM | POA: Diagnosis not present

## 2019-09-15 ENCOUNTER — Other Ambulatory Visit: Payer: Self-pay | Admitting: Family Medicine

## 2019-11-18 ENCOUNTER — Other Ambulatory Visit: Payer: Self-pay

## 2019-11-18 ENCOUNTER — Ambulatory Visit: Payer: Medicare HMO

## 2019-11-18 ENCOUNTER — Encounter: Payer: Self-pay | Admitting: Cardiology

## 2019-11-18 ENCOUNTER — Ambulatory Visit (INDEPENDENT_AMBULATORY_CARE_PROVIDER_SITE_OTHER): Payer: Medicare HMO | Admitting: Cardiology

## 2019-11-18 VITALS — BP 149/72 | HR 51 | Temp 98.1°F | Resp 16 | Ht 70.0 in | Wt 195.6 lb

## 2019-11-18 DIAGNOSIS — I472 Ventricular tachycardia: Secondary | ICD-10-CM | POA: Diagnosis not present

## 2019-11-18 DIAGNOSIS — E7800 Pure hypercholesterolemia, unspecified: Secondary | ICD-10-CM | POA: Diagnosis not present

## 2019-11-18 DIAGNOSIS — R002 Palpitations: Secondary | ICD-10-CM | POA: Diagnosis not present

## 2019-11-18 DIAGNOSIS — R55 Syncope and collapse: Secondary | ICD-10-CM | POA: Diagnosis not present

## 2019-11-18 DIAGNOSIS — R001 Bradycardia, unspecified: Secondary | ICD-10-CM | POA: Diagnosis not present

## 2019-11-18 DIAGNOSIS — I4729 Other ventricular tachycardia: Secondary | ICD-10-CM

## 2019-11-18 DIAGNOSIS — I10 Essential (primary) hypertension: Secondary | ICD-10-CM | POA: Diagnosis not present

## 2019-11-18 MED ORDER — OLMESARTAN MEDOXOMIL-HCTZ 20-12.5 MG PO TABS: 1.0000 | ORAL_TABLET | ORAL | 3 refills | Status: DC

## 2019-11-18 MED ORDER — DILTIAZEM HCL ER COATED BEADS 180 MG PO CP24: 180.0000 mg | ORAL_CAPSULE | Freq: Every day | ORAL | 2 refills | Status: DC
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
## 2019-11-22 ENCOUNTER — Telehealth: Payer: Self-pay

## 2019-11-22 DIAGNOSIS — I480 Paroxysmal atrial fibrillation: Secondary | ICD-10-CM

## 2019-11-22 NOTE — Telephone Encounter (Signed)
VM left from [employer] on after hours line. Returned call. Advised of critical result- afib hr 200 7:24am; 6 second run of afib then returned to sinus 10:16am. Will fax over. Also advise by TW that she had a vm from pt stating that he received a call from [employer] and wanted to know what to do.//ah

## 2019-11-23 ENCOUNTER — Telehealth: Payer: Self-pay

## 2019-11-23 NOTE — Telephone Encounter (Signed)
I spoke to the pt this morning, told him Lavone Nian would be contacting him about Afib seen on Monitor.. I also gave him JG cell # and told him to call if he didn't hear from him soon.  Also [facility] sent him a my chart message stating same.

## 2019-11-23 NOTE — Telephone Encounter (Signed)
   A. Fib with RVR from 13:55 to 14:04 hours on 11/20/2019. Second episode 18:26 hours brief 1 min.  Patient also reports AF on 11/22/2019. I could not find it on [employer].  I was originally called and informed of 6 beat NSVT on 11/20/2019.  Will discuss with patient regarding future management options. Has underlying bradycardia and may be difficult to treat with anti arrhythmics. Also need to evaluate for sinus node dysfunction.    ICD-10-CM   1. Paroxysmal atrial fibrillation (HCC) CHA2DS2-VASc Score is 2.  Yearly risk of stroke: 2.3% (A, HTN).    I48.0     Adrian Prows, MD, [facility] 11/23/2019, 7:09 AM [facility]. [place] Office: [phone]

## 2019-11-26 DIAGNOSIS — L820 Inflamed seborrheic keratosis: Secondary | ICD-10-CM | POA: Diagnosis not present

## 2019-11-26 DIAGNOSIS — L821 Other seborrheic keratosis: Secondary | ICD-10-CM | POA: Diagnosis not present

## 2019-11-26 DIAGNOSIS — D1801 Hemangioma of skin and subcutaneous tissue: Secondary | ICD-10-CM | POA: Diagnosis not present

## 2019-11-26 DIAGNOSIS — D692 Other nonthrombocytopenic purpura: Secondary | ICD-10-CM | POA: Diagnosis not present

## 2019-11-26 DIAGNOSIS — C44519 Basal cell carcinoma of skin of other part of trunk: Secondary | ICD-10-CM | POA: Diagnosis not present

## 2019-11-26 DIAGNOSIS — D225 Melanocytic nevi of trunk: Secondary | ICD-10-CM | POA: Diagnosis not present

## 2019-11-26 DIAGNOSIS — D485 Neoplasm of uncertain behavior of skin: Secondary | ICD-10-CM | POA: Diagnosis not present

## 2019-12-01 ENCOUNTER — Other Ambulatory Visit: Payer: Self-pay | Admitting: *Deleted

## 2019-12-01 MED ORDER — SILDENAFIL CITRATE 20 MG PO TABS: 100.0000 mg | ORAL_TABLET | Freq: Every day | ORAL | 12 refills | Status: DC | PRN

## 2019-12-01 NOTE — Telephone Encounter (Signed)
Left message for Keith [name] that his refill has been sent to [facility] on [address] as requested.

## 2019-12-01 NOTE — Telephone Encounter (Signed)
Sent. Thanks.  ?Please update patient.  ?

## 2019-12-01 NOTE — Telephone Encounter (Signed)
Patient left a voicemail requesting a refill on Sildenafil 20 mg #50. Patient stated that he was using a different pharmacy and now needs it to go to [facility]. Patient requested a call back when this has been done.

## 2019-12-09 DIAGNOSIS — R69 Illness, unspecified: Secondary | ICD-10-CM | POA: Diagnosis not present

## 2019-12-23 ENCOUNTER — Other Ambulatory Visit: Payer: Self-pay

## 2019-12-23 ENCOUNTER — Ambulatory Visit: Payer: Medicare HMO | Admitting: Cardiology

## 2019-12-23 ENCOUNTER — Encounter: Payer: Self-pay | Admitting: Cardiology

## 2019-12-23 VITALS — BP 138/66 | HR 57 | Temp 98.7°F | Ht 70.0 in | Wt 190.8 lb

## 2019-12-23 DIAGNOSIS — R002 Palpitations: Secondary | ICD-10-CM

## 2019-12-23 DIAGNOSIS — I480 Paroxysmal atrial fibrillation: Secondary | ICD-10-CM | POA: Diagnosis not present

## 2019-12-23 DIAGNOSIS — R001 Bradycardia, unspecified: Secondary | ICD-10-CM

## 2019-12-23 DIAGNOSIS — I10 Essential (primary) hypertension: Secondary | ICD-10-CM | POA: Diagnosis not present
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
## 2019-12-30 ENCOUNTER — Other Ambulatory Visit: Payer: Medicare HMO

## 2020-01-07 DIAGNOSIS — C44519 Basal cell carcinoma of skin of other part of trunk: Secondary | ICD-10-CM | POA: Diagnosis not present

## 2020-01-19 ENCOUNTER — Emergency Department (HOSPITAL_BASED_OUTPATIENT_CLINIC_OR_DEPARTMENT_OTHER)
Admission: EM | Admit: 2020-01-19 | Discharge: 2020-01-19 | Disposition: A | Discharge status: Home / Self Care | Payer: Medicare HMO | Attending: Emergency Medicine | Admitting: Emergency Medicine

## 2020-01-19 ENCOUNTER — Emergency Department (HOSPITAL_BASED_OUTPATIENT_CLINIC_OR_DEPARTMENT_OTHER): Payer: Medicare HMO

## 2020-01-19 ENCOUNTER — Encounter (HOSPITAL_BASED_OUTPATIENT_CLINIC_OR_DEPARTMENT_OTHER): Payer: Self-pay

## 2020-01-19 ENCOUNTER — Other Ambulatory Visit: Payer: Self-pay

## 2020-01-19 DIAGNOSIS — Z7901 Long term (current) use of anticoagulants: Secondary | ICD-10-CM | POA: Diagnosis not present

## 2020-01-19 DIAGNOSIS — Z79899 Other long term (current) drug therapy: Secondary | ICD-10-CM | POA: Diagnosis not present

## 2020-01-19 DIAGNOSIS — K5732 Diverticulitis of large intestine without perforation or abscess without bleeding: Secondary | ICD-10-CM | POA: Diagnosis not present

## 2020-01-19 DIAGNOSIS — R1030 Lower abdominal pain, unspecified: Secondary | ICD-10-CM | POA: Diagnosis not present

## 2020-01-19 DIAGNOSIS — I10 Essential (primary) hypertension: Secondary | ICD-10-CM | POA: Diagnosis not present

## 2020-01-19 DIAGNOSIS — K5792 Diverticulitis of intestine, part unspecified, without perforation or abscess without bleeding: Secondary | ICD-10-CM

## 2020-01-19 LAB — COMPREHENSIVE METABOLIC PANEL
ALT: 18 U/L (ref 0–44)
AST: 26 U/L (ref 15–41)
Albumin: 4.2 g/dL (ref 3.5–5.0)
Alkaline Phosphatase: 54 U/L (ref 38–126)
Anion gap: 11 (ref 5–15)
BUN: 17 mg/dL (ref 8–23)
CO2: 24 mmol/L (ref 22–32)
Calcium: 9.4 mg/dL (ref 8.9–10.3)
Chloride: 102 mmol/L (ref 98–111)
Creatinine, Ser: 1.02 mg/dL (ref 0.61–1.24)
GFR calc Af Amer: >60 mL/min (ref >60)
GFR calc non Af Amer: >60 mL/min (ref >60)
Glucose, Bld: 98 mg/dL (ref 70–99)
Potassium: 4 mmol/L (ref 3.5–5.1)
Sodium: 137 mmol/L (ref 135–145)
Total Bilirubin: 0.9 mg/dL (ref 0.3–1.2)
Total Protein: 7.3 g/dL (ref 6.5–8.1)

## 2020-01-19 LAB — CBC WITH DIFFERENTIAL/PLATELET
Abs Immature Granulocytes: 0.04 10*3/uL (ref 0.00–0.07)
Basophils Absolute: 0.1 10*3/uL (ref 0.0–0.1)
Basophils Relative: 1 %
Eosinophils Absolute: 0.1 10*3/uL (ref 0.0–0.5)
Eosinophils Relative: 1 %
HCT: 46.3 % (ref 39.0–52.0)
Hemoglobin: 16 g/dL (ref 13.0–17.0)
Immature Granulocytes: 0 %
Lymphocytes Relative: 16 %
Lymphs Abs: 1.9 10*3/uL (ref 0.7–4.0)
MCH: 30.7 pg (ref 26.0–34.0)
MCHC: 34.6 g/dL (ref 30.0–36.0)
MCV: 88.7 fL (ref 80.0–100.0)
Monocytes Absolute: 1 10*3/uL (ref 0.1–1.0)
Monocytes Relative: 8 %
Neutro Abs: 8.6 10*3/uL — ABNORMAL HIGH (ref 1.7–7.7)
Neutrophils Relative %: 74 %
Platelets: 292 10*3/uL (ref 150–400)
RBC: 5.22 MIL/uL (ref 4.22–5.81)
RDW: 12.3 % (ref 11.5–15.5)
WBC: 11.8 10*3/uL — ABNORMAL HIGH (ref 4.0–10.5)
nRBC: 0 % (ref 0.0–0.2)

## 2020-01-19 LAB — URINALYSIS, ROUTINE W REFLEX MICROSCOPIC
Bilirubin Urine: NEGATIVE
Glucose, UA: NEGATIVE mg/dL
Ketones, ur: NEGATIVE mg/dL
Leukocytes,Ua: NEGATIVE
Nitrite: NEGATIVE
Protein, ur: NEGATIVE mg/dL
Specific Gravity, Urine: 1.02 (ref 1.005–1.030)
pH: 7 (ref 5.0–8.0)

## 2020-01-19 LAB — URINALYSIS, MICROSCOPIC (REFLEX)

## 2020-01-19 LAB — LIPASE, BLOOD: Lipase: 46 U/L (ref 11–51)

## 2020-01-19 MED ORDER — METRONIDAZOLE 500 MG PO TABS
500.0000 mg | ORAL_TABLET | Freq: Once | ORAL | Status: AC
  Administered 2020-01-19: 500 mg via ORAL
  Filled 2020-01-19: qty 1

## 2020-01-19 MED ORDER — CIPROFLOXACIN HCL 500 MG PO TABS: 500.0000 mg | ORAL_TABLET | Freq: Two times a day (BID) | ORAL | 0 refills | Status: DC

## 2020-01-19 MED ORDER — CIPROFLOXACIN HCL 500 MG PO TABS
500.0000 mg | ORAL_TABLET | Freq: Once | ORAL | Status: AC
  Administered 2020-01-19: 500 mg via ORAL
  Filled 2020-01-19: qty 1

## 2020-01-19 MED ORDER — METRONIDAZOLE 500 MG PO TABS: 500.0000 mg | ORAL_TABLET | Freq: Three times a day (TID) | ORAL | 0 refills | Status: DC

## 2020-01-19 NOTE — Discharge Instructions (Signed)
Please pick up medications and take as prescribed You can exercise as tolerated  Follow up with your PCP for recheck in 1-2 weeks  Return to the ED for any worsening symptoms including worsening pain, excessive vomiting, blood in stool, fevers > 100.4
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
## 2020-01-19 NOTE — ED Triage Notes (Signed)
Pt c/o lower abd pain started yesterday-NAD-steady gait

## 2020-01-24 ENCOUNTER — Telehealth: Payer: Self-pay | Admitting: *Deleted

## 2020-01-24 MED ORDER — METRONIDAZOLE 500 MG PO TABS: 500.0000 mg | ORAL_TABLET | Freq: Three times a day (TID) | ORAL | 0 refills | Status: AC

## 2020-01-24 MED ORDER — CIPROFLOXACIN HCL 500 MG PO TABS: 500.0000 mg | ORAL_TABLET | Freq: Two times a day (BID) | ORAL | 0 refills | Status: DC

## 2020-01-24 NOTE — Telephone Encounter (Signed)
Sent. Thanks.  Update [name] if not resolved by the end of the extra days.  I sent another 7 days.  He may be able to stop after extra 3-5 days but I didn't want him to run out.   Hope he feels better soon.

## 2020-01-24 NOTE — Telephone Encounter (Signed)
Patient advised.

## 2020-01-24 NOTE — Telephone Encounter (Signed)
Patient called stating that he was seen last week in the ER for acute diverticulitis. Patient stated that he was only given 7 days worth of mediation. Patient stated that he only has medication for a couple of more days. Patient stated that he has had diverticulitis before and he knows that it takes more than a 7 days worth to clear this up for him. Patient is requesting additional days of medications.  Patient stated that he is doing better, but still has some pain. Patient stated that he was given Cipro and Flagyl. Pharmacy [facility]

## 2020-02-03 ENCOUNTER — Ambulatory Visit: Payer: Medicare HMO | Admitting: Cardiology

## 2020-02-21 ENCOUNTER — Ambulatory Visit: Payer: Medicare HMO | Admitting: Cardiology

## 2020-03-02 DIAGNOSIS — N401 Enlarged prostate with lower urinary tract symptoms: Secondary | ICD-10-CM | POA: Diagnosis not present

## 2020-03-09 DIAGNOSIS — R972 Elevated prostate specific antigen [PSA]: Secondary | ICD-10-CM | POA: Diagnosis not present

## 2020-03-09 DIAGNOSIS — R351 Nocturia: Secondary | ICD-10-CM | POA: Diagnosis not present

## 2020-03-09 DIAGNOSIS — N401 Enlarged prostate with lower urinary tract symptoms: Secondary | ICD-10-CM | POA: Diagnosis not present

## 2020-03-09 DIAGNOSIS — N5201 Erectile dysfunction due to arterial insufficiency: Secondary | ICD-10-CM | POA: Diagnosis not present

## 2020-04-06 IMAGING — CT CT ABD-PELV W/O CM
2 of 4 series · 16 of 46 positions shown, 18 images · non-contrast
Comparison: 03/11/2016.

CLINICAL DATA: Lower abdominal pain for 1 day. Possible
diverticulitis.

EXAM:
CT ABDOMEN AND PELVIS WITHOUT CONTRAST
TECHNIQUE: Multidetector CT imaging of the abdomen and pelvis was performed
following the standard protocol without IV contrast.

[Series 2: axial st · axial · 0.89mm/px · z∈[+590,+1040]mm · 13 of 98 slices shown, 15 images]
[im 4/98  soft-tissue]
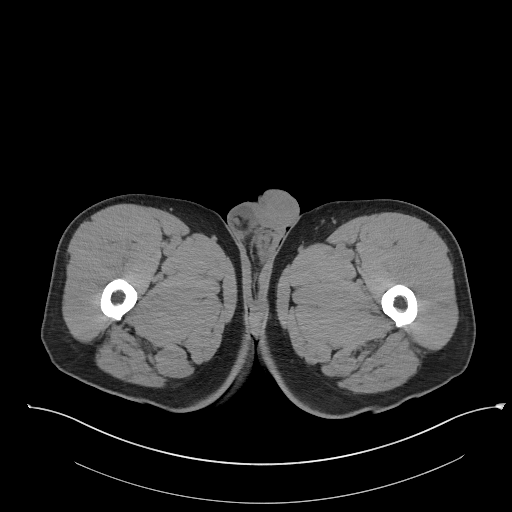
[im 4/98  bone]
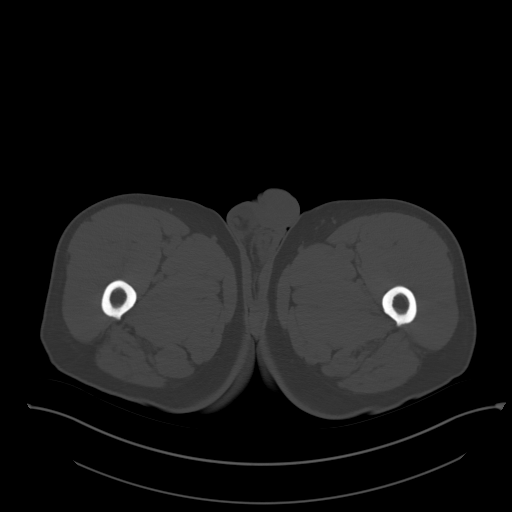
[im 12/98  soft-tissue]
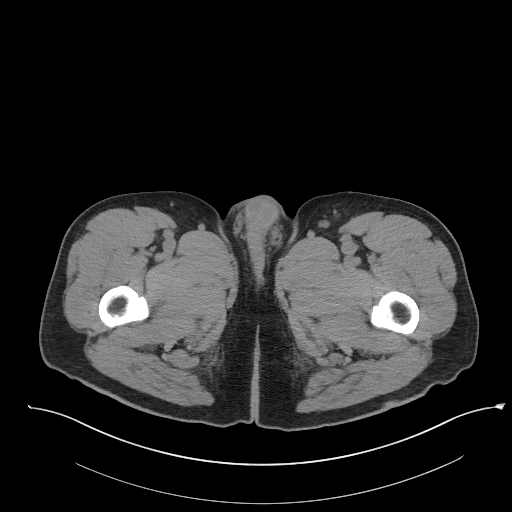
[im 20/98  soft-tissue]
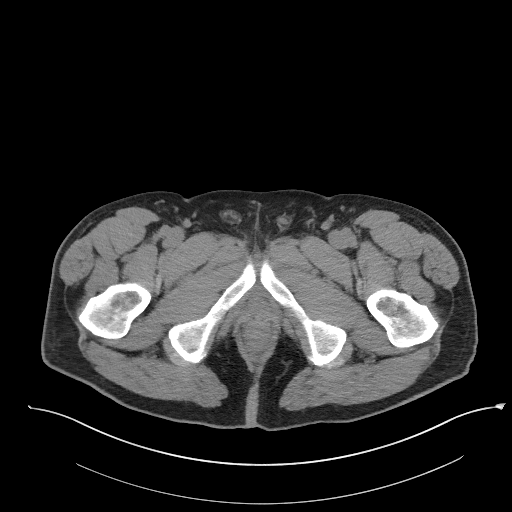
[im 28/98  soft-tissue]
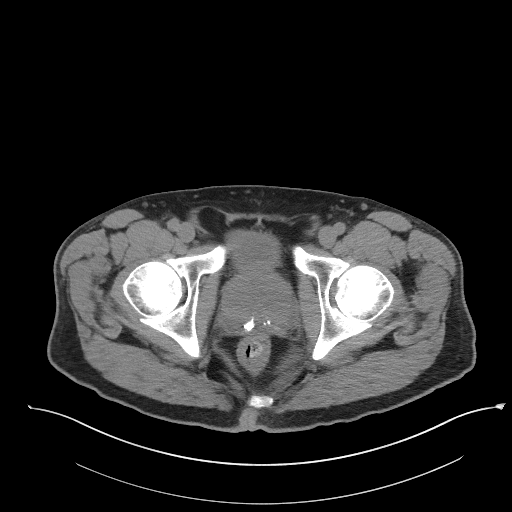
[im 35/98  soft-tissue]
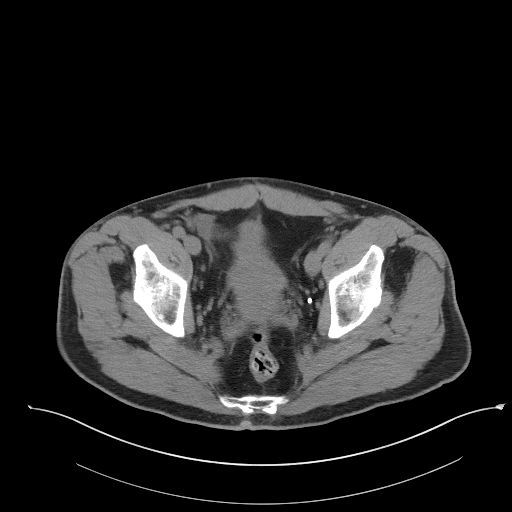
[im 43/98  soft-tissue]
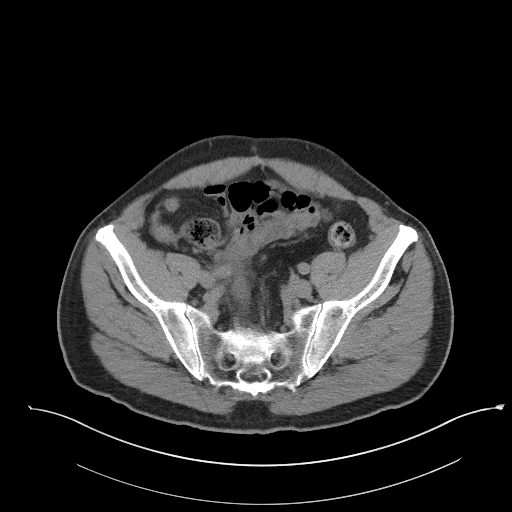
[im 51/98  soft-tissue]
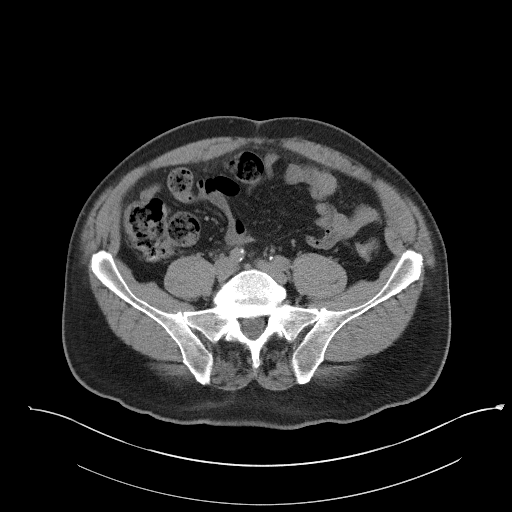
[im 55/98  soft-tissue]
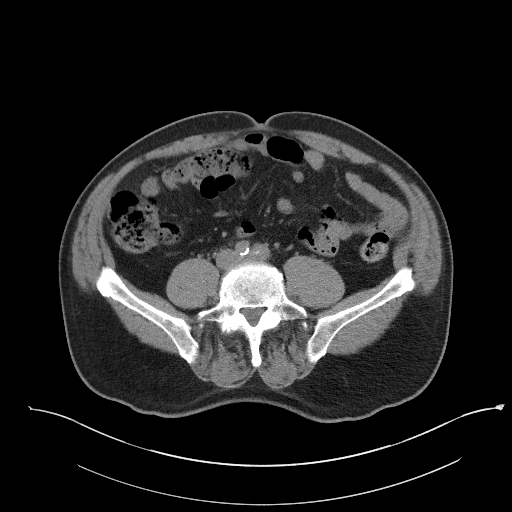
[im 63/98  soft-tissue]
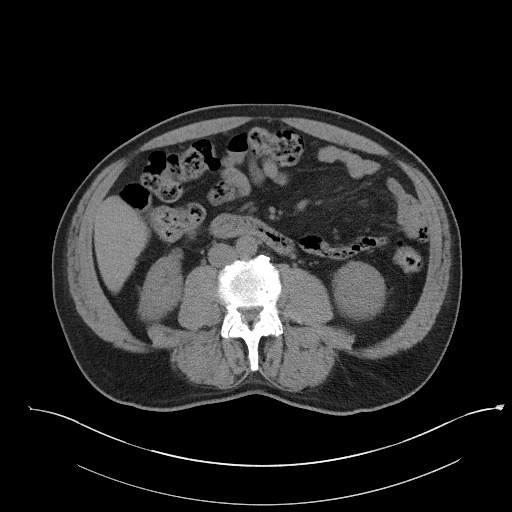
[im 63/98  bone]
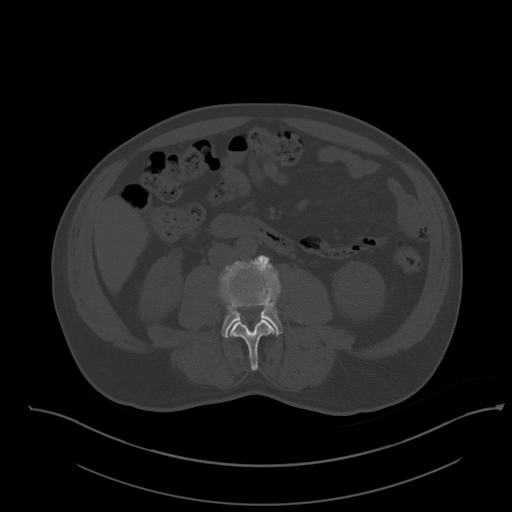
[im 70/98  soft-tissue]
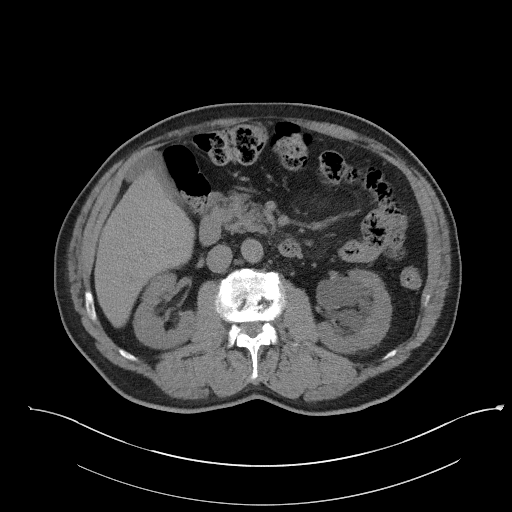
[im 78/98  soft-tissue]
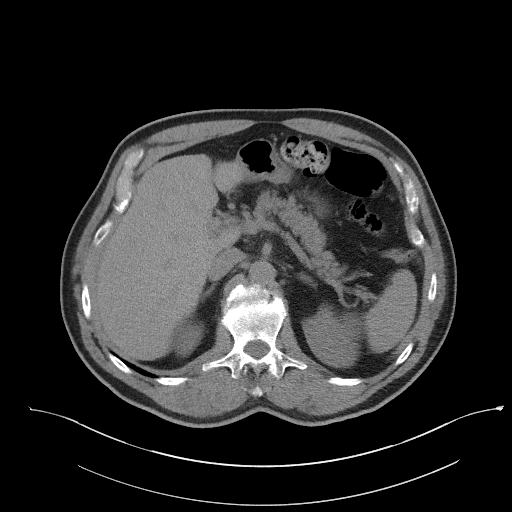
[im 86/98  soft-tissue]
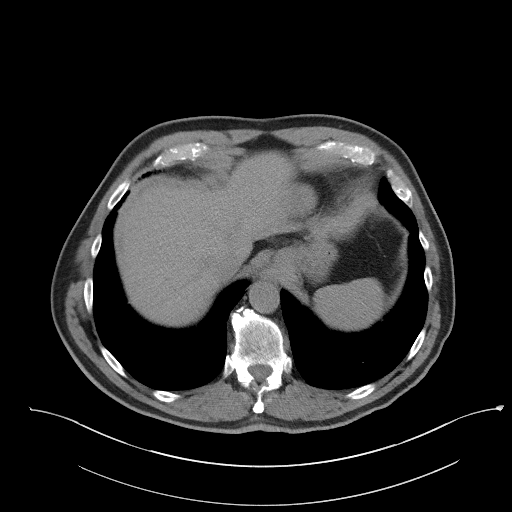
[im 94/98  soft-tissue]
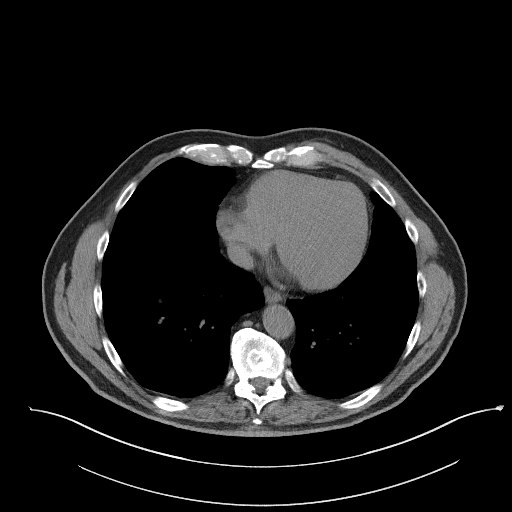

[Series 5: coronal st · coronal · 0.72mm/px · 3 of 101 slices shown]
[im 34/101  soft-tissue]
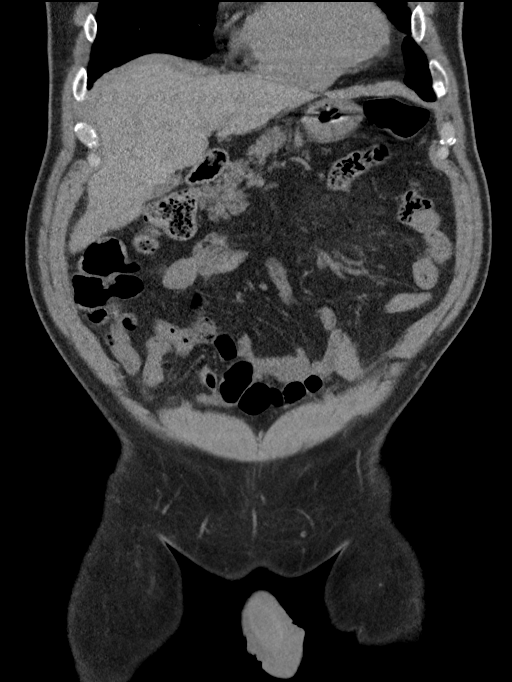
[im 45/101  soft-tissue]
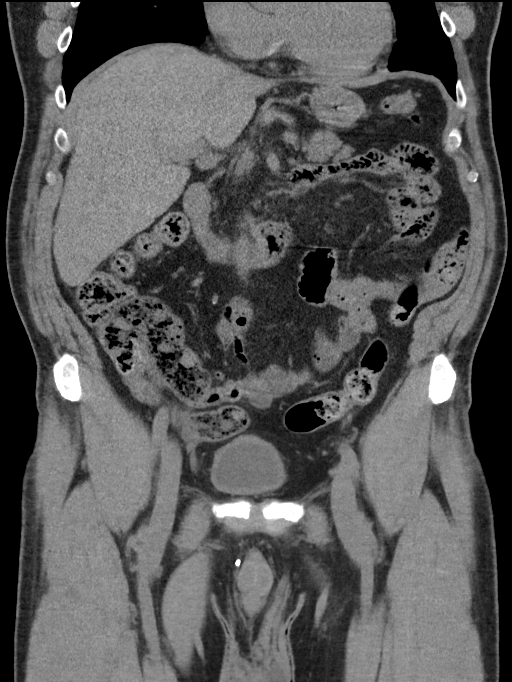
[im 56/101  soft-tissue]
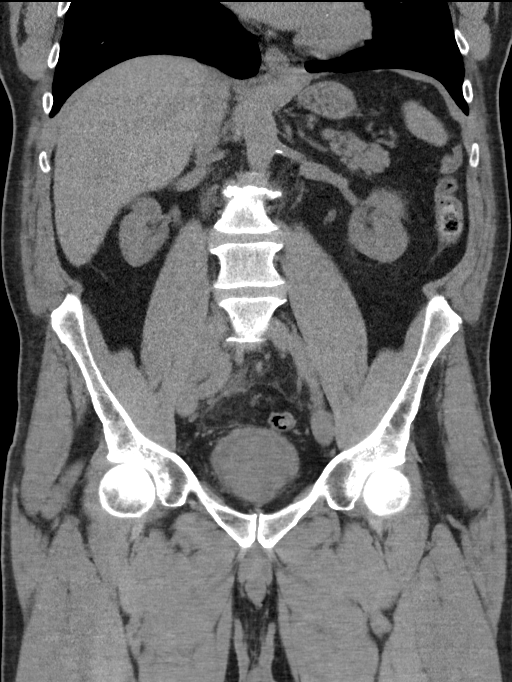

[16 of 46 positions shown; findings below may reference images not displayed]

FINDINGS: Lower chest: Bilateral lower lobe pulmonary nodules measure up to 4
mm on the right, are unchanged and considered benign. Heart size
normal. No pericardial or pleural effusion. Distal esophagus is
unremarkable.

Hepatobiliary: Liver is at the upper limits of normal in size,
cm. Liver and gallbladder are otherwise unremarkable. No biliary
ductal dilatation.

Pancreas: Negative.

Spleen: Negative.

Adrenals/Urinary Tract: Adrenal glands are unremarkable.
Low-attenuation lesions in the kidneys measure up to 4.4 cm on the
left and are likely cysts. No urinary stones. Ureters are
decompressed. Slight bladder wall thickening.

Stomach/Bowel: Stomach, small bowel, appendix and majority of the
colon are unremarkable. There is a sigmoid anastomosis from which
inflammatory stranding and haziness extend from the right lateral
margin 2 adjacent small bowel (2/55-59). No extraluminal air or
organized fluid collection.

Vascular/Lymphatic: Vascular structures are unremarkable. No
pathologically enlarged lymph nodes.

Reproductive: Prostate is enlarged and indents the bladder.

Other: Small right inguinal hernia contains a tiny amount of fluid.
Mesenteries and peritoneum are otherwise unremarkable.

Musculoskeletal: Degenerative changes in the spine. No worrisome
lytic or sclerotic lesions.
IMPRESSION: 1. Inflammatory haziness and stranding extending from the sigmoid
colon are most indicative of diverticulitis. A small bowel source is
considered less likely.
2. Enlarged prostate with bladder wall thickening, indicative of an
element of outlet obstruction.

## 2020-05-02 ENCOUNTER — Other Ambulatory Visit: Payer: Self-pay | Admitting: Family Medicine

## 2020-05-18 ENCOUNTER — Ambulatory Visit: Payer: Medicare HMO

## 2020-05-18 ENCOUNTER — Other Ambulatory Visit: Payer: Self-pay

## 2020-05-18 ENCOUNTER — Ambulatory Visit: Payer: Medicare HMO | Admitting: Family Medicine

## 2020-05-18 DIAGNOSIS — I10 Essential (primary) hypertension: Secondary | ICD-10-CM | POA: Diagnosis not present

## 2020-05-18 DIAGNOSIS — I480 Paroxysmal atrial fibrillation: Secondary | ICD-10-CM

## 2020-05-29 DIAGNOSIS — L821 Other seborrheic keratosis: Secondary | ICD-10-CM | POA: Diagnosis not present

## 2020-05-29 DIAGNOSIS — L905 Scar conditions and fibrosis of skin: Secondary | ICD-10-CM | POA: Diagnosis not present

## 2020-05-29 DIAGNOSIS — Z85828 Personal history of other malignant neoplasm of skin: Secondary | ICD-10-CM | POA: Diagnosis not present

## 2020-05-29 DIAGNOSIS — D1801 Hemangioma of skin and subcutaneous tissue: Secondary | ICD-10-CM | POA: Diagnosis not present

## 2020-05-29 DIAGNOSIS — D229 Melanocytic nevi, unspecified: Secondary | ICD-10-CM | POA: Diagnosis not present

## 2020-05-29 DIAGNOSIS — C4441 Basal cell carcinoma of skin of scalp and neck: Secondary | ICD-10-CM | POA: Diagnosis not present

## 2020-05-29 DIAGNOSIS — D485 Neoplasm of uncertain behavior of skin: Secondary | ICD-10-CM | POA: Diagnosis not present

## 2020-05-29 DIAGNOSIS — C44519 Basal cell carcinoma of skin of other part of trunk: Secondary | ICD-10-CM | POA: Diagnosis not present

## 2020-06-21 ENCOUNTER — Ambulatory Visit: Payer: Medicare HMO | Admitting: Cardiology

## 2020-06-22 ENCOUNTER — Other Ambulatory Visit: Payer: Self-pay | Admitting: Family Medicine

## 2020-06-22 DIAGNOSIS — C4441 Basal cell carcinoma of skin of scalp and neck: Secondary | ICD-10-CM | POA: Diagnosis not present

## 2020-06-22 DIAGNOSIS — C44519 Basal cell carcinoma of skin of other part of trunk: Secondary | ICD-10-CM | POA: Diagnosis not present

## 2020-06-22 DIAGNOSIS — L905 Scar conditions and fibrosis of skin: Secondary | ICD-10-CM | POA: Diagnosis not present

## 2020-06-26 ENCOUNTER — Telehealth: Payer: Self-pay | Admitting: *Deleted

## 2020-06-26 DIAGNOSIS — H903 Sensorineural hearing loss, bilateral: Secondary | ICD-10-CM | POA: Insufficient documentation

## 2020-06-26 DIAGNOSIS — H9313 Tinnitus, bilateral: Secondary | ICD-10-CM | POA: Insufficient documentation

## 2020-06-26 NOTE — Telephone Encounter (Signed)
PA submitted thru [facility] for Bupropion, awaiting response.

## 2020-07-06 DIAGNOSIS — L905 Scar conditions and fibrosis of skin: Secondary | ICD-10-CM | POA: Diagnosis not present

## 2020-07-06 DIAGNOSIS — C4441 Basal cell carcinoma of skin of scalp and neck: Secondary | ICD-10-CM | POA: Diagnosis not present

## 2020-07-10 ENCOUNTER — Ambulatory Visit: Payer: Medicare HMO | Admitting: Cardiology

## 2020-07-10 ENCOUNTER — Other Ambulatory Visit: Payer: Self-pay

## 2020-07-10 ENCOUNTER — Encounter: Payer: Self-pay | Admitting: Cardiology

## 2020-07-10 VITALS — BP 143/64 | HR 55 | Resp 16 | Ht 70.0 in | Wt 198.0 lb

## 2020-07-10 DIAGNOSIS — I10 Essential (primary) hypertension: Secondary | ICD-10-CM | POA: Diagnosis not present

## 2020-07-10 DIAGNOSIS — R002 Palpitations: Secondary | ICD-10-CM

## 2020-07-10 DIAGNOSIS — Z8679 Personal history of other diseases of the circulatory system: Secondary | ICD-10-CM

## 2020-07-10 DIAGNOSIS — E7800 Pure hypercholesterolemia, unspecified: Secondary | ICD-10-CM
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
## 2020-07-12 ENCOUNTER — Encounter: Payer: Self-pay | Admitting: Family Medicine

## 2020-08-15 DIAGNOSIS — R1310 Dysphagia, unspecified: Secondary | ICD-10-CM | POA: Diagnosis not present

## 2020-09-20 DIAGNOSIS — H43811 Vitreous degeneration, right eye: Secondary | ICD-10-CM | POA: Diagnosis not present

## 2020-09-20 DIAGNOSIS — H532 Diplopia: Secondary | ICD-10-CM | POA: Diagnosis not present

## 2020-09-20 DIAGNOSIS — H2513 Age-related nuclear cataract, bilateral: Secondary | ICD-10-CM | POA: Diagnosis not present

## 2020-09-20 DIAGNOSIS — H40013 Open angle with borderline findings, low risk, bilateral: Secondary | ICD-10-CM | POA: Diagnosis not present

## 2020-09-20 DIAGNOSIS — H5022 Vertical strabismus, left eye: Secondary | ICD-10-CM | POA: Diagnosis not present

## 2020-09-30 ENCOUNTER — Other Ambulatory Visit: Payer: Self-pay | Admitting: Family Medicine

## 2020-10-02 NOTE — Telephone Encounter (Signed)
Pharmacy requests refill on: Amlodipine Besylate 10 mg & Metoprolol Succ ER 50 mg  LAST REFILL: 06/22/2020 LAST OV: 05/03/2019 NEXT OV: Not Scheduled  PHARMACY: [facility]

## 2020-10-13 DIAGNOSIS — K611 Rectal abscess: Secondary | ICD-10-CM | POA: Diagnosis not present

## 2020-10-16 DIAGNOSIS — H532 Diplopia: Secondary | ICD-10-CM | POA: Diagnosis not present

## 2020-10-16 DIAGNOSIS — H5022 Vertical strabismus, left eye: Secondary | ICD-10-CM | POA: Diagnosis not present

## 2020-10-16 DIAGNOSIS — H43811 Vitreous degeneration, right eye: Secondary | ICD-10-CM | POA: Diagnosis not present

## 2020-10-16 DIAGNOSIS — K611 Rectal abscess: Secondary | ICD-10-CM | POA: Diagnosis not present

## 2020-10-16 DIAGNOSIS — H40013 Open angle with borderline findings, low risk, bilateral: Secondary | ICD-10-CM | POA: Diagnosis not present

## 2020-10-16 DIAGNOSIS — H2513 Age-related nuclear cataract, bilateral: Secondary | ICD-10-CM | POA: Diagnosis not present

## 2020-10-23 DIAGNOSIS — H5022 Vertical strabismus, left eye: Secondary | ICD-10-CM | POA: Diagnosis not present

## 2020-10-23 DIAGNOSIS — H2513 Age-related nuclear cataract, bilateral: Secondary | ICD-10-CM | POA: Diagnosis not present

## 2020-10-23 DIAGNOSIS — H532 Diplopia: Secondary | ICD-10-CM | POA: Diagnosis not present

## 2020-10-23 DIAGNOSIS — H40013 Open angle with borderline findings, low risk, bilateral: Secondary | ICD-10-CM | POA: Diagnosis not present

## 2020-10-23 DIAGNOSIS — H43811 Vitreous degeneration, right eye: Secondary | ICD-10-CM | POA: Diagnosis not present

## 2020-10-23 DIAGNOSIS — H57812 Brow ptosis, left: Secondary | ICD-10-CM | POA: Diagnosis not present

## 2020-10-27 ENCOUNTER — Other Ambulatory Visit: Payer: Self-pay | Admitting: Family Medicine

## 2020-10-30 ENCOUNTER — Other Ambulatory Visit: Payer: Self-pay | Admitting: Family Medicine

## 2020-10-30 NOTE — Telephone Encounter (Signed)
Please Advise

## 2020-10-31 NOTE — Telephone Encounter (Signed)
Sent. Thanks.   

## 2020-11-03 ENCOUNTER — Other Ambulatory Visit: Payer: Self-pay | Admitting: Family Medicine

## 2020-11-06 NOTE — Telephone Encounter (Signed)
Patient stated that he has not picked the prescription up yet, but it is ready. Patient stated that he will call back to set up an appointment.

## 2020-11-06 NOTE — Telephone Encounter (Signed)
rx was prev sent.  Please check with patient to make sure he got it since the other request came through.  Please check with patient about scheduling a yearly visit when possible.  Thanks.

## 2020-11-06 NOTE — Telephone Encounter (Signed)
Please Advise

## 2020-11-06 NOTE — Telephone Encounter (Signed)
Noted thanks °

## 2020-11-23 DIAGNOSIS — H2511 Age-related nuclear cataract, right eye: Secondary | ICD-10-CM | POA: Diagnosis not present

## 2020-12-02 ENCOUNTER — Other Ambulatory Visit: Payer: Self-pay | Admitting: Family Medicine

## 2020-12-13 NOTE — Telephone Encounter (Signed)
LMTCB

## 2020-12-27 ENCOUNTER — Other Ambulatory Visit: Payer: Self-pay | Admitting: Family Medicine

## 2020-12-27 DIAGNOSIS — I10 Essential (primary) hypertension: Secondary | ICD-10-CM

## 2020-12-31 DIAGNOSIS — H2512 Age-related nuclear cataract, left eye: Secondary | ICD-10-CM | POA: Diagnosis not present

## 2021-01-01 ENCOUNTER — Other Ambulatory Visit (INDEPENDENT_AMBULATORY_CARE_PROVIDER_SITE_OTHER): Payer: Medicare HMO

## 2021-01-01 ENCOUNTER — Other Ambulatory Visit: Payer: Self-pay

## 2021-01-01 DIAGNOSIS — I10 Essential (primary) hypertension: Secondary | ICD-10-CM | POA: Diagnosis not present

## 2021-01-01 LAB — CBC WITH DIFFERENTIAL/PLATELET
Basophils Absolute: 0.1 10*3/uL (ref 0.0–0.1)
Basophils Relative: 1.2 % (ref 0.0–3.0)
Eosinophils Absolute: 0.2 10*3/uL (ref 0.0–0.7)
Eosinophils Relative: 2.6 % (ref 0.0–5.0)
HCT: 44.8 % (ref 39.0–52.0)
Hemoglobin: 15.5 g/dL (ref 13.0–17.0)
Lymphocytes Relative: 22.6 % (ref 12.0–46.0)
Lymphs Abs: 1.3 10*3/uL (ref 0.7–4.0)
MCHC: 34.7 g/dL (ref 30.0–36.0)
MCV: 89.8 fl (ref 78.0–100.0)
Monocytes Absolute: 0.5 10*3/uL (ref 0.1–1.0)
Monocytes Relative: 7.7 % (ref 3.0–12.0)
Neutro Abs: 3.9 10*3/uL (ref 1.4–7.7)
Neutrophils Relative %: 65.9 % (ref 43.0–77.0)
Platelets: 268 10*3/uL (ref 150.0–400.0)
RBC: 4.99 Mil/uL (ref 4.22–5.81)
RDW: 13.1 % (ref 11.5–15.5)
WBC: 5.9 10*3/uL (ref 4.0–10.5)

## 2021-01-01 LAB — LIPID PANEL
Cholesterol: 131 mg/dL (ref 0–200)
HDL: 49.6 mg/dL (ref >39.00)
LDL Cholesterol: 71 mg/dL (ref 0–99)
NonHDL: 81.52
Total CHOL/HDL Ratio: 3
Triglycerides: 53 mg/dL (ref 0.0–149.0)
VLDL: 10.6 mg/dL (ref 0.0–40.0)

## 2021-01-01 LAB — COMPREHENSIVE METABOLIC PANEL
ALT: 16 U/L (ref 0–53)
AST: 22 U/L (ref 0–37)
Albumin: 4.1 g/dL (ref 3.5–5.2)
Alkaline Phosphatase: 52 U/L (ref 39–117)
BUN: 14 mg/dL (ref 6–23)
CO2: 28 mEq/L (ref 19–32)
Calcium: 9.5 mg/dL (ref 8.4–10.5)
Chloride: 105 mEq/L (ref 96–112)
Creatinine, Ser: 1.04 mg/dL (ref 0.40–1.50)
GFR: 70.48 mL/min (ref >60.00)
Glucose, Bld: 104 mg/dL — ABNORMAL HIGH (ref 70–99)
Potassium: 4.3 mEq/L (ref 3.5–5.1)
Sodium: 139 mEq/L (ref 135–145)
Total Bilirubin: 0.6 mg/dL (ref 0.2–1.2)
Total Protein: 7 g/dL (ref 6.0–8.3)

## 2021-01-04 DIAGNOSIS — H2512 Age-related nuclear cataract, left eye: Secondary | ICD-10-CM | POA: Diagnosis not present

## 2021-01-11 ENCOUNTER — Encounter: Payer: Self-pay | Admitting: Family Medicine

## 2021-01-11 ENCOUNTER — Ambulatory Visit (INDEPENDENT_AMBULATORY_CARE_PROVIDER_SITE_OTHER): Payer: Medicare HMO | Admitting: Family Medicine

## 2021-01-11 ENCOUNTER — Other Ambulatory Visit: Payer: Self-pay

## 2021-01-11 VITALS — BP 120/62 | HR 55 | Temp 97.6°F | Ht 69.0 in | Wt 201.0 lb

## 2021-01-11 DIAGNOSIS — I10 Essential (primary) hypertension: Secondary | ICD-10-CM | POA: Diagnosis not present

## 2021-01-11 DIAGNOSIS — E7800 Pure hypercholesterolemia, unspecified: Secondary | ICD-10-CM

## 2021-01-11 DIAGNOSIS — F4310 Post-traumatic stress disorder, unspecified: Secondary | ICD-10-CM | POA: Diagnosis not present

## 2021-01-11 DIAGNOSIS — R413 Other amnesia: Secondary | ICD-10-CM

## 2021-01-11 DIAGNOSIS — Z0000 Encounter for general adult medical examination without abnormal findings: Secondary | ICD-10-CM | POA: Diagnosis not present

## 2021-01-11 DIAGNOSIS — R69 Illness, unspecified: Secondary | ICD-10-CM | POA: Diagnosis not present

## 2021-01-11 DIAGNOSIS — Z7189 Other specified counseling: Secondary | ICD-10-CM

## 2021-01-11 MED ORDER — METOPROLOL SUCCINATE ER 50 MG PO TB24: ORAL_TABLET | ORAL | 3 refills | Status: DC

## 2021-01-11 MED ORDER — BUPROPION HCL ER (XL) 150 MG PO TB24: 450.0000 mg | ORAL_TABLET | Freq: Every day | ORAL | 3 refills | Status: DC

## 2021-01-11 MED ORDER — AMLODIPINE BESYLATE 10 MG PO TABS: 10.0000 mg | ORAL_TABLET | Freq: Every day | ORAL | 3 refills | Status: DC

## 2021-01-11 MED ORDER — ROSUVASTATIN CALCIUM 10 MG PO TABS: ORAL_TABLET | ORAL | 3 refills | Status: DC

## 2021-01-11 MED ORDER — TADALAFIL 20 MG PO TABS
20.0000 mg | ORAL_TABLET | Freq: Every day | ORAL | 1 refills | Status: DC | PRN
Start: 2021-01-11 — End: 2021-11-27

## 2021-01-11 NOTE — Patient Instructions (Signed)
Don't change you meds for now.   I'll work on getting the memory testing set up.   I'll update cardiology about your labs.  Take care.  Glad to see you.
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
## 2021-01-13 DIAGNOSIS — R413 Other amnesia: Secondary | ICD-10-CM | POA: Insufficient documentation

## 2021-01-13 NOTE — Assessment & Plan Note (Signed)
Would continue amlodipine and metoprolol.  Labs discussed with patient He had prev anticoagulation for A fib, off med now after conversation with cards.  Still on beta blocker.  I will defer to patient cardiology, he agrees.  Not having chest pain. Continue work on diet and exercise.

## 2021-01-13 NOTE — Assessment & Plan Note (Signed)
Compliant with Wellbutrin and mood is good.  No adverse effect on medication.
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
## 2021-01-13 NOTE — Assessment & Plan Note (Signed)
Continue Crestor twice weekly.  Lipids are reasonable.  We will update cardiology regarding his labs.  I appreciate the help of all involved.
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
## 2021-01-13 NOTE — Assessment & Plan Note (Signed)
Memory change but not memory loss.  Memory concern discussed with patient.  No red flag events but he has noted some occasional slowing of recall.  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  We talked about options and his history.  We talked about trying to get neurocognitive testing set up.  He had normal recall on brief testing today at the clinic.  He wanted to proceed with neuropsychological testing.  I think this is reasonable.

## 2021-01-29 DIAGNOSIS — Z961 Presence of intraocular lens: Secondary | ICD-10-CM | POA: Diagnosis not present

## 2021-02-05 ENCOUNTER — Telehealth: Payer: Self-pay

## 2021-02-05 MED ORDER — DOXYCYCLINE HYCLATE 100 MG PO TABS: 100.0000 mg | ORAL_TABLET | Freq: Two times a day (BID) | ORAL | 0 refills | Status: DC

## 2021-02-05 NOTE — Telephone Encounter (Addendum)
Sent. Thanks.  If he isn't improving then we need to check him in clinic.  I would treat preemptively.  I would defer the labs in the meantime.

## 2021-02-05 NOTE — Telephone Encounter (Signed)
Notified patient rx was sent and advised patient to make appt if does not improve.

## 2021-02-05 NOTE — Addendum Note (Signed)
Addended by: Tonia Ghent on: 02/05/2021 09:36 AM   Modules accepted: Orders

## 2021-02-05 NOTE — Telephone Encounter (Signed)
Patient called and states he was bitten by a tick; pulled a small one off of him on Saturday. He is unsure how long tick was there but thinks it was at least a couple of days. Since Sunday he has had a headache which is unusual for him. No fever or rashes present. Wants to know if you can send in rx for doxycycline for him to treat the remote possibility of early rocky mtn spotted fever? He will come in for visit or labs if needed. If rx is sent he wants sent to [facility] [address]

## 2021-02-12 DIAGNOSIS — R41844 Frontal lobe and executive function deficit: Secondary | ICD-10-CM | POA: Diagnosis not present

## 2021-02-12 DIAGNOSIS — F32A Depression, unspecified: Secondary | ICD-10-CM | POA: Diagnosis not present

## 2021-02-12 DIAGNOSIS — Z87820 Personal history of traumatic brain injury: Secondary | ICD-10-CM | POA: Diagnosis not present

## 2021-02-26 DIAGNOSIS — R69 Illness, unspecified: Secondary | ICD-10-CM | POA: Diagnosis not present

## 2021-02-26 DIAGNOSIS — Z712 Person consulting for explanation of examination or test findings: Secondary | ICD-10-CM | POA: Diagnosis not present

## 2021-02-28 DIAGNOSIS — R972 Elevated prostate specific antigen [PSA]: Secondary | ICD-10-CM | POA: Diagnosis not present

## 2021-03-07 DIAGNOSIS — R351 Nocturia: Secondary | ICD-10-CM | POA: Diagnosis not present

## 2021-03-07 DIAGNOSIS — N401 Enlarged prostate with lower urinary tract symptoms: Secondary | ICD-10-CM | POA: Diagnosis not present

## 2021-03-07 DIAGNOSIS — R972 Elevated prostate specific antigen [PSA]: Secondary | ICD-10-CM | POA: Diagnosis not present

## 2021-03-07 DIAGNOSIS — N5201 Erectile dysfunction due to arterial insufficiency: Secondary | ICD-10-CM | POA: Diagnosis not present

## 2021-04-08 DIAGNOSIS — Z1159 Encounter for screening for other viral diseases: Secondary | ICD-10-CM | POA: Diagnosis not present

## 2021-05-21 ENCOUNTER — Encounter: Payer: Self-pay | Admitting: Family Medicine

## 2021-05-21 ENCOUNTER — Telehealth (INDEPENDENT_AMBULATORY_CARE_PROVIDER_SITE_OTHER): Payer: Medicare HMO | Admitting: Family Medicine

## 2021-05-21 ENCOUNTER — Telehealth: Payer: Self-pay | Admitting: Family Medicine

## 2021-05-21 DIAGNOSIS — U071 COVID-19: Secondary | ICD-10-CM

## 2021-05-21 MED ORDER — MOLNUPIRAVIR EUA 200MG CAPSULE: 4.0000 | ORAL_CAPSULE | Freq: Two times a day (BID) | ORAL | 0 refills | Status: AC

## 2021-05-21 NOTE — Telephone Encounter (Signed)
Noted. Thanks.

## 2021-05-21 NOTE — Progress Notes (Signed)
Interactive audio and video telecommunications were attempted between this provider and patient, however failed, due to patient having technical difficulties OR patient did not have access to video capability.  We continued and completed visit with audio only.   Virtual Visit via Telephone Note  I connected with patient on 05/21/21  at 4:56 PM  by telephone and verified that I am speaking with the correct person using two identifiers.  Location of patient: home   Location of MD: [place] Keith of referring provider (if blank then none associated): Names per persons and role in encounter:  MD: Earlyne Iba, Patient: Keith [name].    I discussed the limitations, risks, security and privacy concerns of performing an evaluation and management service by telephone and the availability of in person appointments. I also discussed with the patient that there may be a patient responsible charge related to this service. The patient expressed understanding and agreed to proceed.  CC: covid  History of Present Illness: quick onset yesterday, R ear pain, then congestion, fever.  Tested positive yesterday.  HA.  No loss of taste and smell.  Dec in appetite.  Not SOB.  No CP.  Some cough with talking.  Dry cough.  HA inc with cough.  Pulse ox 95%.    He called into work, d/w pt.  He had some left over cough medicine to use.      Observations/Objective:  No apparent distress Speech normal.  Assessment and Plan: COVID.  Okay for outpatient follow-up.  Discussed options and rationale for treatment.  There is no indication that he would need ER or hospital evaluation at this point.  It would make sense to defer Paxlovid given potential interactions with his other medications.  Molnupiravir would be a reasonable option.  It does not appear that he is ill enough to need infusion treatment.  He is vaccinated.  We talked about options and he wanted to hold Molnupiravir tonight and see how he felt  tomorrow morning.  He is well within the treatment window to start the medication tomorrow.  Supportive care otherwise.  He agrees with plan.  He will update me as needed.  Follow Up Instructions: see [name].    I discussed the assessment and treatment plan with the patient. The patient was provided an opportunity to ask questions and all were answered. The patient agreed with the plan and demonstrated an understanding of the instructions.   The patient was advised to call back or seek an in-person evaluation if the symptoms worsen or if the condition fails to improve as anticipated.  I provided 17 minutes of non-face-to-face time during this encounter.  Elsie Stain, MD

## 2021-05-21 NOTE — Telephone Encounter (Signed)
Pt reports low grade fever, chills, chest congestion, dry cough and extreme fatigue x 1 day. Pt denies all other symptoms. Pt would like a VV. Scheduled for today at 4:30. Pt is not sure if he will be able to do VV but he will try. Advised to try to have vital signs ready if possible. Advised if any symptoms worsened or he developed any  new symptoms to contact office or go to the ER. Pt verbalized understanding.

## 2021-05-21 NOTE — Telephone Encounter (Signed)
Please check with patient about covid positive result.  Per report congestion and spO2 ~95%.  Sx started yesterday.  Please add on with me today at the end of clinic if patient agrees with that, VV or phone visit.  Thanks.

## 2021-05-23 DIAGNOSIS — U071 COVID-19: Secondary | ICD-10-CM | POA: Insufficient documentation

## 2021-05-23 NOTE — Assessment & Plan Note (Signed)
COVID.  Okay for outpatient follow-up.  Discussed options and rationale for treatment.  There is no indication that he would need ER or hospital evaluation at this point.  It would make sense to defer Paxlovid given potential interactions with his other medications.  Molnupiravir would be a reasonable option.  It does not appear that he is ill enough to need infusion treatment.  He is vaccinated.  We talked about options and he wanted to hold Molnupiravir tonight and see how he felt tomorrow morning.  He is well within the treatment window to start the medication tomorrow.  Supportive care otherwise.  He agrees with plan.  He will update me as needed.

## 2021-05-30 ENCOUNTER — Telehealth: Payer: Self-pay | Admitting: Family Medicine

## 2021-05-30 NOTE — Telephone Encounter (Signed)
D/w pt about current situation.  S/p antiviral tx for covid, took w/o ADE on med.  Not worse, has improved some but still with mild dry cough,  some nausea and some fatigue.  Not needing tx for his sx, based on his report.  Not SOB.  D/w pt about residual sx.  I would expect his sx to gradually improve as his symptoms are common soon after covid infection.  He is still masking per routine.  He'll update me as needed.  He agrees with plan.

## 2021-06-18 ENCOUNTER — Telehealth: Payer: Self-pay

## 2021-06-18 ENCOUNTER — Other Ambulatory Visit: Payer: Self-pay

## 2021-06-18 ENCOUNTER — Encounter: Payer: Self-pay | Admitting: Family Medicine

## 2021-06-18 ENCOUNTER — Ambulatory Visit (INDEPENDENT_AMBULATORY_CARE_PROVIDER_SITE_OTHER): Payer: Medicare HMO | Admitting: Family Medicine

## 2021-06-18 DIAGNOSIS — H43811 Vitreous degeneration, right eye: Secondary | ICD-10-CM | POA: Diagnosis not present

## 2021-06-18 DIAGNOSIS — H57812 Brow ptosis, left: Secondary | ICD-10-CM | POA: Diagnosis not present

## 2021-06-18 DIAGNOSIS — H5022 Vertical strabismus, left eye: Secondary | ICD-10-CM | POA: Diagnosis not present

## 2021-06-18 DIAGNOSIS — R059 Cough, unspecified: Secondary | ICD-10-CM | POA: Diagnosis not present

## 2021-06-18 DIAGNOSIS — H40013 Open angle with borderline findings, low risk, bilateral: Secondary | ICD-10-CM | POA: Diagnosis not present

## 2021-06-18 DIAGNOSIS — H532 Diplopia: Secondary | ICD-10-CM | POA: Diagnosis not present

## 2021-06-18 DIAGNOSIS — Z961 Presence of intraocular lens: Secondary | ICD-10-CM | POA: Diagnosis not present

## 2021-06-18 NOTE — Patient Instructions (Signed)
If you don't have improvement with ibuprofen then let me know and we can go from there (CXR, etc).  Take care.  Glad to see you.

## 2021-06-18 NOTE — Progress Notes (Signed)
This visit occurred during the SARS-CoV-2 public health emergency.  Safety protocols were in place, including screening questions prior to the visit, additional usage of staff PPE, and extensive cleaning of exam room while observing appropriate contact time as indicated for disinfecting solutions.  Cough.  Had covid last month.  Molnupiravir used with relief.  Still with some cough.  Not pain but sensation of pressure in the chest w/o exertional pain.  Minimal sputum, no wheeze.  Pain near L scapula.  Worked out this AM for 1 hour w/o CP or SOB.  Annoyed by cough. No hemoptysis.  No abd pain, no blood in stool.    Meds, vitals, and allergies reviewed.   ROS: Per HPI unless specifically indicated in ROS section   Nad Ncat Neck supple, no LA Sensation on L chest and L upper back noted on palpation but not particularly painful.   Ctab, no wheeze. Rrr, no murmur.   Abdomen soft.  Normal bowel sounds. Extremities without edema.  Skin well perfused. Speech normal.

## 2021-06-18 NOTE — Telephone Encounter (Addendum)
Pt had covid one mth ago; starting 06/17/21 CP upper lt side of chest and back; the pain is dull pain on and off at pain level 1. No radiation of pain to neck or shoulder. Pt is having pain now. No SOB and pt is not in any distress at this time. Pt has lingering cough that goes from dry to wet but not productive. No fever,S/T, runnynose, head congestion and pt wants to see Dr Damita Dunnings so he can listen to pts chest. Dr Damita Dunnings will see pt 06/18/21 at 4:30. UC & ED precautions given and pt voiced understanding. When access nurse note comes in to portal will attach to this note. Sending note to DR Damita Dunnings. Pt is aware xray equipment is not available due to repairs and pt voiced understanding.

## 2021-06-18 NOTE — Telephone Encounter (Signed)
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 Aura Dials Relationship To Patient Self Return Phone Number [phone] (Primary) Chief Complaint CHEST PAIN - pain, pressure, heaviness or tightness Reason for Call Symptomatic / Request for [place] states he had covid about a month ago with a lingering cough. He now has chest pain . Translation No Nurse Assessment Nurse: Raphael Gibney, RN, Vera Date/Time (Eastern Time): 06/18/2021 1:28:06 PM Confirm and document reason for call. If symptomatic, describe symptoms. ---Caller states he had COVID about a month ago. Still has a cough. has left sided chest pain and left scapular area. no fever. Does the patient have any new or worsening symptoms? ---Yes Will a triage be completed? ---Yes Related visit to physician within the last 2 weeks? ---No Does the PT have any chronic conditions? (i.e. diabetes, asthma, this includes High risk factors for pregnancy, etc.) ---Yes List chronic conditions. ---HTN Is this a behavioral health or substance abuse call? ---No Guidelines Guideline Title Affirmed Question Affirmed Notes Nurse Date/Time (Eastern Time) Chest Pain Pain also in shoulder(s) or arm(s) or jaw (Exception: pain is clearly made worse by movement) Raphael Gibney, RN, Vanita Ingles 06/18/2021 1:29:58 PM Disp. Time Eilene Ghazi Time) Disposition Final User 06/18/2021 1:25:11 PM Send to Urgent Jule Economy, Baxter Flattery PLEASE NOTE: All timestamps  contained within this report are represented as [place] Standard Time. CONFIDENTIALTY NOTICE: This fax transmission is intended only for the addressee. It contains information that is legally privileged, confidential or otherwise protected from use or disclosure. If you are not the intended recipient, you are strictly prohibited from reviewing, disclosing, copying using or disseminating any of this information or taking any action in reliance on or regarding this information. If you have received this fax in error, please notify us immediately by telephone so that we can arrange for its return to [name]. Phone: [phone], Toll-Free: [phone], Fax: [phone] Page: 2 of 2 Call Id: [id number] 06/18/2021 1:34:54 PM Go to ED Now Yes Raphael Gibney, RN, Doreatha Lew Disagree/Comply Disagree Caller Understands Yes PreDisposition Call Doctor Care Advice Given Per Guideline GO TO ED NOW: * You need to be seen in the Emergency Department. Comments User: Dannielle Burn, RN Date/Time Eilene Ghazi Time): 06/18/2021 1:34:12 PM pt states he does not want to go to the ER. User: Dannielle Burn, RN Date/Time Eilene Ghazi Time): 06/18/2021 1:36:58 PM Called back line and gave report that pt is having chest pain with triage outcome of to to ER now but he does not want to go but wants to have an appt and xray. Referrals GO TO FACILITY REFUSED

## 2021-06-18 NOTE — Telephone Encounter (Signed)
Will see at OV.  Thanks.  

## 2021-06-20 DIAGNOSIS — R059 Cough, unspecified: Secondary | ICD-10-CM | POA: Insufficient documentation

## 2021-06-20 NOTE — Assessment & Plan Note (Signed)
We talked about options.  Neither of [name] suspects an ominous diagnosis at this point since his lungs are clear and he does not have a murmur or a rub that I can hear.  He could have a postinfectious cough along with chest wall irritation. If no improvement with ibuprofen then let he will me know and we can go from there (CXR, etc).  He agrees with plan.  Okay for outpatient follow-up.

## 2021-07-05 DIAGNOSIS — Z85828 Personal history of other malignant neoplasm of skin: Secondary | ICD-10-CM | POA: Diagnosis not present

## 2021-07-05 DIAGNOSIS — L905 Scar conditions and fibrosis of skin: Secondary | ICD-10-CM | POA: Diagnosis not present

## 2021-07-05 DIAGNOSIS — L821 Other seborrheic keratosis: Secondary | ICD-10-CM | POA: Diagnosis not present

## 2021-07-05 DIAGNOSIS — D1801 Hemangioma of skin and subcutaneous tissue: Secondary | ICD-10-CM | POA: Diagnosis not present

## 2021-07-05 DIAGNOSIS — D485 Neoplasm of uncertain behavior of skin: Secondary | ICD-10-CM | POA: Diagnosis not present

## 2021-07-05 DIAGNOSIS — C4441 Basal cell carcinoma of skin of scalp and neck: Secondary | ICD-10-CM | POA: Diagnosis not present

## 2021-07-05 DIAGNOSIS — L820 Inflamed seborrheic keratosis: Secondary | ICD-10-CM | POA: Diagnosis not present

## 2021-07-09 ENCOUNTER — Encounter: Payer: Self-pay | Admitting: Cardiology

## 2021-07-09 ENCOUNTER — Other Ambulatory Visit: Payer: Self-pay

## 2021-07-09 ENCOUNTER — Ambulatory Visit: Payer: Medicare HMO | Admitting: Cardiology

## 2021-07-09 VITALS — BP 127/63 | HR 55 | Temp 98.1°F | Resp 16 | Ht 69.0 in | Wt 201.0 lb

## 2021-07-09 DIAGNOSIS — I10 Essential (primary) hypertension: Secondary | ICD-10-CM | POA: Diagnosis not present

## 2021-07-09 DIAGNOSIS — R002 Palpitations: Secondary | ICD-10-CM

## 2021-07-09 DIAGNOSIS — R001 Bradycardia, unspecified: Secondary | ICD-10-CM

## 2021-07-09 DIAGNOSIS — E7800 Pure hypercholesterolemia, unspecified: Secondary | ICD-10-CM
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
## 2021-08-21 DIAGNOSIS — L578 Other skin changes due to chronic exposure to nonionizing radiation: Secondary | ICD-10-CM | POA: Diagnosis not present

## 2021-08-21 DIAGNOSIS — L570 Actinic keratosis: Secondary | ICD-10-CM | POA: Diagnosis not present

## 2021-08-21 DIAGNOSIS — C4441 Basal cell carcinoma of skin of scalp and neck: Secondary | ICD-10-CM | POA: Diagnosis not present

## 2021-09-04 DIAGNOSIS — M25561 Pain in right knee: Secondary | ICD-10-CM | POA: Diagnosis not present

## 2021-09-16 DIAGNOSIS — M25561 Pain in right knee: Secondary | ICD-10-CM | POA: Diagnosis not present

## 2021-09-20 DIAGNOSIS — M25561 Pain in right knee: Secondary | ICD-10-CM | POA: Diagnosis not present

## 2021-11-22 ENCOUNTER — Other Ambulatory Visit: Payer: Self-pay | Admitting: Gastroenterology

## 2021-11-22 DIAGNOSIS — K219 Gastro-esophageal reflux disease without esophagitis: Secondary | ICD-10-CM | POA: Diagnosis not present

## 2021-11-22 DIAGNOSIS — Z800 Family history of malignant neoplasm of digestive organs: Secondary | ICD-10-CM | POA: Diagnosis not present

## 2021-11-22 DIAGNOSIS — R1310 Dysphagia, unspecified: Secondary | ICD-10-CM | POA: Diagnosis not present

## 2021-11-27 ENCOUNTER — Ambulatory Visit (INDEPENDENT_AMBULATORY_CARE_PROVIDER_SITE_OTHER): Payer: Medicare HMO | Admitting: Family Medicine

## 2021-11-27 ENCOUNTER — Other Ambulatory Visit: Payer: Self-pay

## 2021-11-27 ENCOUNTER — Encounter: Payer: Self-pay | Admitting: Family Medicine

## 2021-11-27 VITALS — BP 122/76 | HR 53 | Temp 97.6°F | Ht 69.0 in | Wt 196.0 lb

## 2021-11-27 DIAGNOSIS — I10 Essential (primary) hypertension: Secondary | ICD-10-CM

## 2021-11-27 DIAGNOSIS — Z87828 Personal history of other (healed) physical injury and trauma: Secondary | ICD-10-CM

## 2021-11-27 DIAGNOSIS — R4586 Emotional lability: Secondary | ICD-10-CM | POA: Diagnosis not present

## 2021-11-27 DIAGNOSIS — K219 Gastro-esophageal reflux disease without esophagitis: Secondary | ICD-10-CM

## 2021-11-27 DIAGNOSIS — R69 Illness, unspecified: Secondary | ICD-10-CM | POA: Diagnosis not present

## 2021-11-27 LAB — LIPID PANEL
Cholesterol: 147 mg/dL (ref 0–200)
HDL: 48.3 mg/dL (ref >39.00)
LDL Cholesterol: 87 mg/dL (ref 0–99)
NonHDL: 98.84
Total CHOL/HDL Ratio: 3
Triglycerides: 61 mg/dL (ref 0.0–149.0)
VLDL: 12.2 mg/dL (ref 0.0–40.0)

## 2021-11-27 LAB — CBC WITH DIFFERENTIAL/PLATELET
Basophils Absolute: 0.1 10*3/uL (ref 0.0–0.1)
Basophils Relative: 0.8 % (ref 0.0–3.0)
Eosinophils Absolute: 0.2 10*3/uL (ref 0.0–0.7)
Eosinophils Relative: 1.8 % (ref 0.0–5.0)
HCT: 48.7 % (ref 39.0–52.0)
Hemoglobin: 16.8 g/dL (ref 13.0–17.0)
Lymphocytes Relative: 24.4 % (ref 12.0–46.0)
Lymphs Abs: 2.1 10*3/uL (ref 0.7–4.0)
MCHC: 34.4 g/dL (ref 30.0–36.0)
MCV: 88.8 fl (ref 78.0–100.0)
Monocytes Absolute: 0.7 10*3/uL (ref 0.1–1.0)
Monocytes Relative: 8.4 % (ref 3.0–12.0)
Neutro Abs: 5.6 10*3/uL (ref 1.4–7.7)
Neutrophils Relative %: 64.6 % (ref 43.0–77.0)
Platelets: 300 10*3/uL (ref 150.0–400.0)
RBC: 5.48 Mil/uL (ref 4.22–5.81)
RDW: 12.9 % (ref 11.5–15.5)
WBC: 8.6 10*3/uL (ref 4.0–10.5)

## 2021-11-27 LAB — COMPREHENSIVE METABOLIC PANEL
ALT: 18 U/L (ref 0–53)
AST: 26 U/L (ref 0–37)
Albumin: 4.5 g/dL (ref 3.5–5.2)
Alkaline Phosphatase: 51 U/L (ref 39–117)
BUN: 17 mg/dL (ref 6–23)
CO2: 29 mEq/L (ref 19–32)
Calcium: 9.8 mg/dL (ref 8.4–10.5)
Chloride: 101 mEq/L (ref 96–112)
Creatinine, Ser: 1.18 mg/dL (ref 0.40–1.50)
GFR: 60.19 mL/min (ref >60.00)
Glucose, Bld: 81 mg/dL (ref 70–99)
Potassium: 4.5 mEq/L (ref 3.5–5.1)
Sodium: 138 mEq/L (ref 135–145)
Total Bilirubin: 0.9 mg/dL (ref 0.2–1.2)
Total Protein: 7.4 g/dL (ref 6.0–8.3)

## 2021-11-27 LAB — TSH: TSH: 1.44 u[IU]/mL (ref 0.35–5.50)

## 2021-11-27 MED ORDER — TADALAFIL 20 MG PO TABS: 20.0000 mg | ORAL_TABLET | ORAL | Status: DC

## 2021-11-27 NOTE — Patient Instructions (Addendum)
Please ask the front for a record release from ortho.  Dr. Stann Mainland [address], [address]  Go to the lab on the way out.   If you have mychart we'll likely use that to update you.    Take care.  Glad to see you. Don't change your meds for now.   I'll check on options in the meantime.

## 2021-11-27 NOTE — Progress Notes (Signed)
This visit occurred during the SARS-CoV-2 public health emergency.  Safety protocols were in place, including screening questions prior to the visit, additional usage of staff PPE, and extensive cleaning of exam room while observing appropriate contact time as indicated for disinfecting solutions.  Prev cough resolved after covid.  He had cognitive testing done w/o alarming findings.  D/w pt.    He has EGD/GI f/u pending.  He had dysphagia with rice, d/w pt. only GI notes.  He had seen ortho about L medial meniscus tear.  Pain is better now.  He is back to doing squats in the meantime, ie he is improved.  Baseline bradycardia, BP has been controlled on outside checks.  He attributed his headaches (occ noted) to his neck.  This is at baseline and not worse than normal. I asked him to update me as needed.  No HA this week.  Frontal, improves with ibuprofen and no associated neuro sx. we talked about rechecking routine labs given his history of hypertension.  His mood is lower.  D/w pt.  He has h/o SAD and "I think I'm depressed."  He is having to work harder to get up and get moving on his day off, to get his chores done/etc.  He is in long term relationship with Vaughan Basta.  He is still spending time with grandkids.  Still working.  No SI/HI.  Still on wellbutrin at baseline.  No etoh.  He had talked with Dr. Freddi Che but Dr. Pablo Ledger is reportedly out of practice.  We talked about counseling options.  Cog/behav therapy helped in the past.  He is back to working out 3 times a week.    His hours were cut at work, d/w pt.  Still working about 2 afternoons as week.   Meds, vitals, and allergies reviewed.   ROS: Per HPI unless specifically indicated in ROS section   GEN: nad, alert and oriented HEENT: ncat NECK: supple w/o LA CV: rrr PULM: ctab, no inc wob ABD: soft, +bs EXT: no edema SKIN: Well-perfused.  35 minutes were devoted to patient care in this encounter (this includes time spent  reviewing the patient's file/history, interviewing and examining the patient, counseling/reviewing plan with patient).

## 2021-11-28 DIAGNOSIS — Z87828 Personal history of other (healed) physical injury and trauma: Secondary | ICD-10-CM | POA: Insufficient documentation

## 2021-11-28 NOTE — Assessment & Plan Note (Signed)
His blood pressure is controlled and we did not think that was the source of his headaches.  No change in amlodipine and metoprolol this point.

## 2021-11-28 NOTE — Assessment & Plan Note (Signed)
Has seen orthopedics, pain is better now he is back to doing squats/exercising, he has improved.

## 2021-11-28 NOTE — Assessment & Plan Note (Signed)
I will await GI follow-up notes.

## 2021-11-28 NOTE — Assessment & Plan Note (Addendum)
Likely multiple factors contribute, seasonal contribution, work changes, etc.  No suicidal or homicidal intent.  Still okay for outpatient follow-up.  We talked about options.  We agreed it was reasonable to defer any med changes at this point but it would make sense to get him set up with counseling.  Referral placed.  He can update me as needed.  He agrees with plan.

## 2021-12-17 DIAGNOSIS — H532 Diplopia: Secondary | ICD-10-CM | POA: Diagnosis not present

## 2021-12-17 DIAGNOSIS — H5022 Vertical strabismus, left eye: Secondary | ICD-10-CM | POA: Diagnosis not present

## 2021-12-17 DIAGNOSIS — Z961 Presence of intraocular lens: Secondary | ICD-10-CM | POA: Diagnosis not present

## 2021-12-17 DIAGNOSIS — H57812 Brow ptosis, left: Secondary | ICD-10-CM | POA: Diagnosis not present

## 2021-12-17 DIAGNOSIS — H40013 Open angle with borderline findings, low risk, bilateral: Secondary | ICD-10-CM | POA: Diagnosis not present

## 2021-12-17 DIAGNOSIS — H43811 Vitreous degeneration, right eye: Secondary | ICD-10-CM | POA: Diagnosis not present

## 2021-12-28 ENCOUNTER — Ambulatory Visit: Payer: Medicare HMO | Admitting: Psychologist

## 2022-01-16 ENCOUNTER — Other Ambulatory Visit: Payer: Self-pay

## 2022-01-16 ENCOUNTER — Ambulatory Visit (INDEPENDENT_AMBULATORY_CARE_PROVIDER_SITE_OTHER): Payer: Medicare HMO | Admitting: Psychologist

## 2022-01-16 DIAGNOSIS — R69 Illness, unspecified: Secondary | ICD-10-CM | POA: Diagnosis not present

## 2022-01-16 DIAGNOSIS — F330 Major depressive disorder, recurrent, mild: Secondary | ICD-10-CM | POA: Diagnosis not present
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
## 2022-01-16 NOTE — Progress Notes (Signed)
                Keith Taborn, PsyD 

## 2022-01-16 NOTE — Plan of Care (Signed)

## 2022-01-21 ENCOUNTER — Encounter (HOSPITAL_COMMUNITY): Payer: Self-pay | Admitting: Gastroenterology

## 2022-01-25 ENCOUNTER — Other Ambulatory Visit: Payer: Self-pay | Admitting: Family Medicine

## 2022-01-28 NOTE — Anesthesia Preprocedure Evaluation (Addendum)
Anesthesia Evaluation  ?Patient identified by MRN, date of birth, ID band ?Patient awake ? ? ? ?Reviewed: ?Allergy & Precautions, NPO status , Patient's Chart, lab work & pertinent test results ? ?History of Anesthesia Complications ?(+) DIFFICULT AIRWAY and history of anesthetic complications ? ?Airway ?Mallampati: III ? ?TM Distance: >3 FB ?Neck ROM: Limited ? ? ? Dental ?no notable dental hx. ?(+) Caps, Dental Advisory Given ?  ?Pulmonary ?asthma ,  ?  ?Pulmonary exam normal ?breath sounds clear to auscultation ? ? ? ? ? ? Cardiovascular ?hypertension, Pt. on medications ?Normal cardiovascular exam+ dysrhythmias Atrial Fibrillation  ?Rhythm:Regular Rate:Normal ? ? ?  ?Neuro/Psych ?PSYCHIATRIC DISORDERS (PTSD) Anxiety Depression   ? GI/Hepatic ?GERD  ,  ?Endo/Other  ? ? Renal/GU ?negative Renal ROS  ? ?  ?Musculoskeletal ? ?(+) Arthritis ,  ? Abdominal ?  ?Peds ? Hematology ?  ?Anesthesia Other Findings ?ALL: Flomax, lipitor, zocor, IVP Dye, sertraline, fuoxetine  ? Reproductive/Obstetrics ? ?  ? ? ? ? ? ? ? ? ? ? ? ? ? ?  ?  ? ? ? ? ? ? ? ?Anesthesia Physical ?Anesthesia Plan ? ?ASA: 3 ? ?Anesthesia Plan: MAC  ? ?Post-op Pain Management:   ? ?Induction: Intravenous ? ?PONV Risk Score and Plan: 2 and Treatment may vary due to age or medical condition ? ?Airway Management Planned: Oral ETT ? ?Additional Equipment: None ? ?Intra-op Plan:  ? ?Post-operative Plan:  ? ?Informed Consent: I have reviewed the patients History and Physical, chart, labs and discussed the procedure including the risks, benefits and alternatives for the proposed anesthesia with the patient or authorized representative who has indicated his/her understanding and acceptance.  ? ? ? ?Dental advisory given ? ?Plan Discussed with: CRNA and Anesthesiologist ? ?Anesthesia Plan Comments: (EGD for dysphagia and family hx of colon CA difficult airway)  ? ? ? ? ? ?Anesthesia Quick Evaluation ? ?
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
## 2022-01-29 ENCOUNTER — Ambulatory Visit (HOSPITAL_COMMUNITY)
Admission: RE | Admit: 2022-01-29 | Discharge: 2022-01-29 | Disposition: A | Discharge status: Home / Self Care | Payer: Medicare HMO | Source: Ambulatory Visit | Attending: Gastroenterology | Admitting: Gastroenterology

## 2022-01-29 ENCOUNTER — Ambulatory Visit (HOSPITAL_BASED_OUTPATIENT_CLINIC_OR_DEPARTMENT_OTHER): Payer: Medicare HMO | Admitting: Anesthesiology

## 2022-01-29 ENCOUNTER — Encounter (HOSPITAL_COMMUNITY)
Admission: RE | Disposition: A | Discharge status: Home / Self Care | Payer: Self-pay | Source: Ambulatory Visit | Attending: Gastroenterology

## 2022-01-29 ENCOUNTER — Ambulatory Visit (HOSPITAL_COMMUNITY): Payer: Medicare HMO | Admitting: Anesthesiology

## 2022-01-29 ENCOUNTER — Other Ambulatory Visit: Payer: Self-pay

## 2022-01-29 ENCOUNTER — Encounter (HOSPITAL_COMMUNITY): Payer: Self-pay | Admitting: Gastroenterology

## 2022-01-29 DIAGNOSIS — K3189 Other diseases of stomach and duodenum: Secondary | ICD-10-CM

## 2022-01-29 DIAGNOSIS — K222 Esophageal obstruction: Secondary | ICD-10-CM | POA: Insufficient documentation

## 2022-01-29 DIAGNOSIS — K2950 Unspecified chronic gastritis without bleeding: Secondary | ICD-10-CM | POA: Diagnosis not present

## 2022-01-29 DIAGNOSIS — K209 Esophagitis, unspecified without bleeding: Secondary | ICD-10-CM | POA: Diagnosis not present

## 2022-01-29 DIAGNOSIS — Z980 Intestinal bypass and anastomosis status: Secondary | ICD-10-CM | POA: Diagnosis not present

## 2022-01-29 DIAGNOSIS — K648 Other hemorrhoids: Secondary | ICD-10-CM | POA: Diagnosis not present

## 2022-01-29 DIAGNOSIS — Z7982 Long term (current) use of aspirin: Secondary | ICD-10-CM | POA: Insufficient documentation

## 2022-01-29 DIAGNOSIS — D120 Benign neoplasm of cecum: Secondary | ICD-10-CM | POA: Diagnosis not present

## 2022-01-29 DIAGNOSIS — K2100 Gastro-esophageal reflux disease with esophagitis, without bleeding: Secondary | ICD-10-CM | POA: Diagnosis not present

## 2022-01-29 DIAGNOSIS — K319 Disease of stomach and duodenum, unspecified: Secondary | ICD-10-CM | POA: Diagnosis not present

## 2022-01-29 DIAGNOSIS — E7800 Pure hypercholesterolemia, unspecified: Secondary | ICD-10-CM | POA: Diagnosis not present

## 2022-01-29 DIAGNOSIS — Z800 Family history of malignant neoplasm of digestive organs: Secondary | ICD-10-CM | POA: Insufficient documentation

## 2022-01-29 DIAGNOSIS — I4891 Unspecified atrial fibrillation: Secondary | ICD-10-CM | POA: Diagnosis not present

## 2022-01-29 DIAGNOSIS — I10 Essential (primary) hypertension: Secondary | ICD-10-CM | POA: Diagnosis not present

## 2022-01-29 DIAGNOSIS — D219 Benign neoplasm of connective and other soft tissue, unspecified: Secondary | ICD-10-CM | POA: Diagnosis not present

## 2022-01-29 DIAGNOSIS — F32A Depression, unspecified: Secondary | ICD-10-CM | POA: Insufficient documentation

## 2022-01-29 DIAGNOSIS — Z79899 Other long term (current) drug therapy: Secondary | ICD-10-CM | POA: Diagnosis not present

## 2022-01-29 DIAGNOSIS — D214 Benign neoplasm of connective and other soft tissue of abdomen: Secondary | ICD-10-CM | POA: Diagnosis not present

## 2022-01-29 DIAGNOSIS — K635 Polyp of colon: Secondary | ICD-10-CM

## 2022-01-29 DIAGNOSIS — R1310 Dysphagia, unspecified: Secondary | ICD-10-CM | POA: Diagnosis not present

## 2022-01-29 DIAGNOSIS — D122 Benign neoplasm of ascending colon: Secondary | ICD-10-CM | POA: Diagnosis not present

## 2022-01-29 DIAGNOSIS — F419 Anxiety disorder, unspecified: Secondary | ICD-10-CM | POA: Insufficient documentation

## 2022-01-29 DIAGNOSIS — K2970 Gastritis, unspecified, without bleeding: Secondary | ICD-10-CM | POA: Diagnosis not present

## 2022-01-29 HISTORY — PX: ESOPHAGOGASTRODUODENOSCOPY (EGD) WITH PROPOFOL: SHX5813

## 2022-01-29 HISTORY — PX: POLYPECTOMY: SHX5525

## 2022-01-29 HISTORY — PX: BIOPSY: SHX5522

## 2022-01-29 HISTORY — PX: COLONOSCOPY WITH PROPOFOL: SHX5780

## 2022-01-29 HISTORY — PX: BALLOON DILATION: SHX5330

## 2022-01-29 HISTORY — PX: HEMOSTASIS CLIP PLACEMENT: SHX6857

## 2022-01-29 SURGERY — COLONOSCOPY WITH PROPOFOL
Anesthesia: Monitor Anesthesia Care

## 2022-01-29 MED ORDER — PROPOFOL 500 MG/50ML IV EMUL
INTRAVENOUS | Status: DC | PRN
Start: 2022-01-29 — End: 2022-01-29
  Administered 2022-01-29: 150 ug/kg/min via INTRAVENOUS

## 2022-01-29 MED ORDER — PROPOFOL 10 MG/ML IV BOLUS
INTRAVENOUS | Status: DC | PRN
  Administered 2022-01-29: 20 mg via INTRAVENOUS

## 2022-01-29 MED ORDER — LACTATED RINGERS IV SOLN: INTRAVENOUS | Status: DC

## 2022-01-29 MED ORDER — SODIUM CHLORIDE 0.9 % IV SOLN: INTRAVENOUS | Status: DC

## 2022-01-29 SURGICAL SUPPLY — 25 items

## 2022-01-29 NOTE — Anesthesia Postprocedure Evaluation (Signed)
Anesthesia Post Note ? ?Patient: Keith Beckmann, MD ? ?Procedure(s) Performed: COLONOSCOPY WITH PROPOFOL ?ESOPHAGOGASTRODUODENOSCOPY (EGD) WITH PROPOFOL ?BALLOON DILATION ?BIOPSY ?POLYPECTOMY ?HEMOSTASIS CLIP PLACEMENT ? ?  ? ?Patient location during evaluation: Endoscopy ?Anesthesia Type: MAC ?Level of consciousness: awake and alert ?Pain management: pain level controlled ?Vital Signs Assessment: post-procedure vital signs reviewed and stable ?Respiratory status: spontaneous breathing, nonlabored ventilation, respiratory function stable and patient connected to nasal cannula oxygen ?Cardiovascular status: blood pressure returned to baseline and stable ?Postop Assessment: no apparent nausea or vomiting ?Anesthetic complications: no ? ? ?No notable events documented. ? ?Last Vitals:  ?Vitals:  ? 01/29/22 1020 01/29/22 1030  ?BP: (!) 135/52   ?Pulse: (!) 49   ?Resp: 17 16  ?Temp:    ?SpO2: 100% 100%  ?  ?Last Pain:  ?Vitals:  ? 01/29/22 1020  ?TempSrc:   ?PainSc: 0-No pain  ? ? ?  ?  ?  ?  ?  ?  ? ?Barnet Glasgow ? ? ? ? ?

## 2022-01-29 NOTE — Interval H&P Note (Signed)
History and Physical Interval Note: ? ?01/29/2022 ?8:25 AM ? ?Keith Beckmann, MD  has presented today for EGD with dilation and colonoscopy with the diagnosis of Family History of Colon Cancer, Esophageal Dysphagia.  The various methods of treatment have been discussed with the patient and family. After consideration of risks, benefits and other options for treatment, the patient has consented to  Procedure(s): ?COLONOSCOPY WITH PROPOFOL (N/A) ?ESOPHAGOGASTRODUODENOSCOPY (EGD) WITH PROPOFOL (N/A) ?BALLOON DILATION (N/A) as a surgical intervention.  The patient's history has been reviewed, patient examined, no change in status, stable for surgery.  I have reviewed the patient's chart and labs.  Questions were answered to the patient's satisfaction.   ? ? ?Ronnette Juniper ? ? ?

## 2022-01-29 NOTE — Discharge Instructions (Signed)

## 2022-01-29 NOTE — Op Note (Signed)
[facility] ?Patient Name: Keith [name] ?Procedure Date: 01/29/2022 ?MRN: [id number] ?Attending MD: Ronnette Juniper , MD ?Date of Birth: 06-11-1946 ?CSN: [id number] ?Age: 76 ?Admit Type: Outpatient ?Procedure:                Colonoscopy ?Indications:              Screening in patient at increased risk: Family  ?                          history of 1st-degree relative with colorectal  ?                          cancer before age 8 years, Last colonoscopy: 2018 ?Providers:                Ronnette Juniper, MD, Ladoris Gene, RN, Elberta Fortis  ?                          Johnella Moloney, Technician ?Referring MD:             Mervin Hack ?Medicines:                Monitored Anesthesia Care ?Complications:            No immediate complications. Estimated blood loss:  ?                          Minimal. ?Estimated Blood Loss:     Estimated blood loss was minimal. ?Procedure:                Pre-Anesthesia Assessment: ?                          - Prior to the procedure, a History and Physical  ?                          was performed, and patient medications and  ?                          allergies were reviewed. The patient's tolerance of  ?                          previous anesthesia was also reviewed. The risks  ?                          and benefits of the procedure and the sedation  ?                          options and risks were discussed with the patient.  ?                          All questions were answered, and informed consent  ?                          was obtained. Prior Anticoagulants: The patient has  ?  taken no previous anticoagulant or antiplatelet  ?                          agents except for aspirin. ASA Grade Assessment:  ?                          III - A patient with severe systemic disease. After  ?                          reviewing the risks and benefits, the patient was  ?                          deemed in satisfactory condition to undergo the  ?                           procedure. ?                          - Prior to the procedure, a History and Physical  ?                          was performed, and patient medications and  ?                          allergies were reviewed. The patient's tolerance of  ?                          previous anesthesia was also reviewed. The risks  ?                          and benefits of the procedure and the sedation  ?                          options and risks were discussed with the patient.  ?                          All questions were answered, and informed consent  ?                          was obtained. Prior Anticoagulants: The patient has  ?                          taken no previous anticoagulant or antiplatelet  ?                          agents except for aspirin. ASA Grade Assessment:  ?                          III - A patient with severe systemic disease. After  ?                          reviewing the risks and benefits, the patient was  ?  deemed in satisfactory condition to undergo the  ?                          procedure. ?                          After obtaining informed consent, the colonoscope  ?                          was passed under direct vision. Throughout the  ?                          procedure, the patient's blood pressure, pulse, and  ?                          oxygen saturations were monitored continuously. The  ?                          [id number]-HQ190L ([id number]) Olympus colonoscope was  ?                          introduced through the anus and advanced to the the  ?                          terminal ileum. The colonoscopy was performed  ?                          without difficulty. The patient tolerated the  ?                          procedure well. The quality of the bowel  ?                          preparation was adequate to identify polyps 6 mm  ?                          and larger in size. ?Scope In: 9:31:21 AM ?Scope Out: 9:46:13 AM ?Scope Withdrawal Time: 0 hours 13  minutes 19 seconds  ?Total Procedure Duration: 0 hours 14 minutes 52 seconds  ?Findings: ?     The perianal and digital rectal examinations were normal. ?     Six sessile polyps were found in the ascending colon(5) and cecum(1).  ?     The polyps were 4 to 9 mm in size. These polyps were removed with a hot  ?     snare and cold biopsy forceps. Resection and retrieval were complete. ?     There was evidence of a prior end-to-end colo-colonic anastomosis in the  ?     sigmoid colon. This was patent and was characterized by healthy  ?     appearing mucosa. The anastomosis was traversed. ?     The terminal ileum appeared normal. ?     Non-bleeding internal hemorrhoids were found during retroflexion. ?     The exam was otherwise without abnormality. ?Impression:               - Six 4 to 9 mm polyps in the  ascending colon and  ?                          in the cecum, removed with a hot snare. Resected  ?                          and retrieved. ?                          - Patent end-to-end colo-colonic anastomosis,  ?                          characterized by healthy appearing mucosa. ?                          - The examined portion of the ileum was normal. ?                          - Non-bleeding internal hemorrhoids. ?                          - The examination was otherwise normal. ?Moderate Sedation: ?     Patient did not receive moderate sedation for this procedure, but  ?     instead received monitored anesthesia care. ?Recommendation:           - Patient has a contact number available for  ?                          emergencies. The signs and symptoms of potential  ?                          delayed complications were discussed with the  ?                          patient. Return to normal activities tomorrow.  ?                          Written discharge instructions were provided to the  ?                          patient. ?                          - Resume regular diet. ?                          - Continue  present medications. ?                          - Await pathology results. ?                          - Repeat colonoscopy for surveillance based on  ?                          pathology results. ?Procedure Code(s):        ---  Professional --- ?                          [phone], Colonoscopy, flexible; with removal of  ?                          tumor(s), polyp(s), or other lesion(s) by snare  ?                          technique ?Diagnosis Code(s):        --- Professional --- ?                          Z80.0, Family history of malignant neoplasm of  ?                          digestive organs ?                          K63.5, Polyp of colon ?                          Z98.0, Intestinal bypass and anastomosis status ?                          K64.8, Other hemorrhoids ?CPT copyright 2019 [employer]. All rights reserved. ?The codes documented in this report are preliminary and upon coder review may  ?be revised to meet current compliance requirements. ?Ronnette Juniper, MD ?01/29/2022 9:55:31 AM ?This report has been signed electronically. ?Number of Addenda: 0 ?

## 2022-01-29 NOTE — Transfer of Care (Signed)
Immediate Anesthesia Transfer of Care Note ? ?Patient: Keith Beckmann, MD ? ?Procedure(s) Performed: COLONOSCOPY WITH PROPOFOL ?ESOPHAGOGASTRODUODENOSCOPY (EGD) WITH PROPOFOL ?BALLOON DILATION ?BIOPSY ?POLYPECTOMY ? ?Patient Location: Endoscopy Unit ? ?Anesthesia Type:MAC ? ?Level of Consciousness: sedated and patient cooperative ? ?Airway & Oxygen Therapy: Patient Spontanous Breathing and Patient connected to face mask oxygen ? ?Post-op Assessment: Report given to RN, Post -op Vital signs reviewed and stable and Patient moving all extremities ? ?Post vital signs: Reviewed and stable ? ?Last Vitals:  ?Vitals Value Taken Time  ?BP    ?Temp    ?Pulse 45 01/29/22 0954  ?Resp 16 01/29/22 0954  ?SpO2 96 % 01/29/22 0954  ?Vitals shown include unvalidated device data. ? ?Last Pain:  ?Vitals:  ? 01/29/22 0821  ?TempSrc: Temporal  ?   ? ?  ? ?Complications: No notable events documented. ?

## 2022-01-29 NOTE — Op Note (Signed)
[facility] ?Patient Name: Keith [name] ?Procedure Date: 01/29/2022 ?MRN: [id number] ?Attending MD: Ronnette Juniper , MD ?Date of Birth: [birth year] ?CSN: [id number] ?Age: 76 ?Admit Type: Outpatient ?Procedure:                Upper GI endoscopy ?Indications:              Dysphagia ?Providers:                Ronnette Juniper, MD, Ladoris Gene, RN, Elberta Fortis  ?                          Johnella Moloney, Technician ?Referring MD:             Mervin Hack ?Medicines:                Monitored Anesthesia Care ?Complications:            No immediate complications. Estimated blood loss:  ?                          Minimal. ?Estimated Blood Loss:     Estimated blood loss was minimal. ?Procedure:                Pre-Anesthesia Assessment: ?                          - Prior to the procedure, a History and Physical  ?                          was performed, and patient medications and  ?                          allergies were reviewed. The patient's tolerance of  ?                          previous anesthesia was also reviewed. The risks  ?                          and benefits of the procedure and the sedation  ?                          options and risks were discussed with the patient.  ?                          All questions were answered, and informed consent  ?                          was obtained. Prior Anticoagulants: The patient has  ?                          taken no previous anticoagulant or antiplatelet  ?                          agents except for aspirin. ASA Grade Assessment:  ?                          III - A patient with  severe systemic disease. After  ?                          reviewing the risks and benefits, the patient was  ?                          deemed in satisfactory condition to undergo the  ?                          procedure. ?                          After obtaining informed consent, the endoscope was  ?                          passed under direct vision. Throughout the  ?                           procedure, the patient's blood pressure, pulse, and  ?                          oxygen saturations were monitored continuously. The  ?                          [id number]-[id number] ([id number]) Olympus endoscope was introduced  ?                          through the mouth, and advanced to the second part  ?                          of duodenum. The upper GI endoscopy was  ?                          accomplished without difficulty. The patient  ?                          tolerated the procedure well. ?Scope In: ?Scope Out: ?Findings: ?     A low-grade of narrowing Schatzki ring was found at the gastroesophageal  ?     junction. A TTS dilator was passed through the scope. Dilation with an  ?     18-19-20 mm balloon dilator was performed to 20 mm. The dilation site  ?     was examined following endoscope reinsertion and showed moderate mucosal  ?     disruption and moderate improvement in luminal narrowing. ?     The exam of the esophagus was otherwise normal. ?     Biopsies were taken with a cold forceps in the esophagus to evaluate for  ?     eosinophilic esophagitis. ?     A single 7 mm submucosal papule (nodule) with no bleeding and no  ?     stigmata of recent bleeding was found in the cardia. Biopsies were taken  ?     with a cold forceps for histology. To close a defect after mucosal  ?     resection, one hemostatic clip was successfully placed (MR conditional).  ?     There  was no bleeding at the end of the procedure. ?     Diffuse mildly erythematous mucosa without bleeding was found in the  ?     gastric body and in the gastric antrum. Biopsies were taken with a cold  ?     forceps for Helicobacter pylori testing. ?     The examined duodenum was normal. ?Impression:               - Low-grade of narrowing Schatzki ring. Dilated. ?                          - A single submucosal papule (nodule) found in the  ?                          stomach. Biopsied. Clip (MR conditional) was placed. ?                          -  Erythematous mucosa in the gastric body and  ?                          antrum. Biopsied. ?                          - Normal examined duodenum. ?                          - Biopsies were taken with a cold forceps for  ?                          evaluation of eosinophilic esophagitis. ?Moderate Sedation: ?     Patient did not receive moderate sedation for this procedure, but  ?     instead received monitored anesthesia care. ?Recommendation:           - Patient has a contact number available for  ?                          emergencies. The signs and symptoms of potential  ?                          delayed complications were discussed with the  ?                          patient. Return to normal activities tomorrow.  ?                          Written discharge instructions were provided to the  ?                          patient. ?                          - Resume regular diet. ?                          - Continue present medications. ?                          -  Await pathology results. ?Procedure Code(s):        --- Professional --- ?                          [phone], Esophagogastroduodenoscopy, flexible,  ?                          transoral; with transendoscopic balloon dilation of  ?                          esophagus (less than 30 mm diameter) ?                          [id number], 59, Esophagogastroduodenoscopy, flexible,  ?                          transoral; with biopsy, single or multiple ?Diagnosis Code(s):        --- Professional --- ?                          K22.2, Esophageal obstruction ?                          K31.89, Other diseases of stomach and duodenum ?                          R13.10, Dysphagia, unspecified ?CPT copyright 2019 American Medical Association. All rights reserved. ?The codes documented in this report are preliminary and upon coder review may  ?be revised to meet current compliance requirements. ?Ronnette Juniper, MD ?01/29/2022 9:51:30 AM ?This report has been signed  electronically. ?Number of Addenda: 0 ?

## 2022-01-29 NOTE — Anesthesia Procedure Notes (Signed)
Procedure Name: [place] ?Date/Time: 01/29/2022 9:12 AM ?Performed by: Niel Hummer, CRNA ?Pre-anesthesia Checklist: Emergency Drugs available, Patient identified, Suction available and Patient being monitored ?Oxygen Delivery Method: Simple face mask ? ? ? ? ?

## 2022-01-31 LAB — SURGICAL PATHOLOGY

## 2022-02-05 ENCOUNTER — Ambulatory Visit (INDEPENDENT_AMBULATORY_CARE_PROVIDER_SITE_OTHER): Payer: Medicare HMO | Admitting: Psychologist

## 2022-02-05 ENCOUNTER — Other Ambulatory Visit: Payer: Self-pay

## 2022-02-05 DIAGNOSIS — F330 Major depressive disorder, recurrent, mild: Secondary | ICD-10-CM | POA: Diagnosis not present

## 2022-02-05 DIAGNOSIS — R69 Illness, unspecified: Secondary | ICD-10-CM | POA: Diagnosis not present

## 2022-02-05 NOTE — Progress Notes (Signed)
[facility] Counselor/Therapist Progress Note ? ?Patient ID: Keith Beckmann, MD, MRN: [id number],   ? ?Date: 02/05/2022 ? ?Time Spent: 9:03 am to 9:45 am; total time: 42 minutes ? ?This session was held via in person. The patient consented to in-person therapy and was in the clinician's office. Limits of confidentiality were discussed with the patient.  ? ?Treatment Type: Individual Therapy ? ?Reported Symptoms: Depression ? ?Mental Status Exam: ?Appearance:  Casual     ?Behavior: Appropriate  ?Motor: Normal  ?Speech/Language:  Clear and Coherent  ?Affect: Appropriate  ?Mood: normal  ?Thought process: normal  ?Thought content:   WNL  ?Sensory/Perceptual disturbances:   WNL  ?Orientation: oriented to person, place, and time/date  ?Attention: Good  ?Concentration: Good  ?Memory: WNL  ?Fund of knowledge:  Good  ?Insight:   Fair  ?Judgment:  Fair  ?Impulse Control: Good  ? ?Risk Assessment: ?Danger to Self:  No ?Self-injurious Behavior: No ?Danger to Others: No ?Duty to Warn:no ?Physical Aggression / Violence:No  ?Access to Firearms a concern: No  ?Gang Involvement:No  ? ?Subjective: Beginning the session, patient described himself as "emotionally hardened". After reviewing the treatment plan, the patient elaborated indicating that he goes in cycles of feeling "hardened". Per the patient, previously it assisted him to increase participating in self-care activities. Patient reflected on what this looks like to him. He also indicated that he and his girlfriend are still together despite acknowledging feeling irritable towards her at times. He was agreeable to homework discussed and following up. He denied suicidal and homicidal ideation.   ? ?Interventions:  Worked on developing a therapeutic relationship with the patient using active listening and reflective statements. Provided emotional support using empathy and validation. Reviewed the treatment plan with the patient. Reviewed events since the intake.  Normalized and validated expressed thoughts and emotions. Reflected on the relationship dynamic with girlfriend. Used solution focused therapy to identify previously used beneficial tools. Reflected on behavioral activation previously used. Explored ways to implement and initiate behavioral activation into life. Used socratic questions to assist the patient gain insight. Explored the idea of volunteering. Assisted in problem solving. Assigned homework. Assessed for suicidal and homicidal ideation.  ? ?Homework: Implement previously used behavioral activation tools ? ?Next Session: Emotional support and review homework ? ?Diagnosis: F33.0 major depressive affective disorder, recurrent, mild  ? ?Plan:  ?Client Abilities: Friendly  ? ?Client Preferences: CBT and ACT ? ?Client statement of Needs: Process emotions ? ?Treatment Level: Outpatient ? ?Goals ?Alleviate depressive symptoms ?Recognize, accept, and cope with depressive feelings ?Develop healthy thinking patterns ?Develop healthy interpersonal relationships ? ?Objectives target date for all objectives is 01/17/2023 ?Cooperate with a medication evaluation by a physician ?Verbalize an accurate understanding of depression ?Verbalize an understanding of the treatment ?Identify and replace thoughts that support depression ?Learn and implement behavioral strategies ?Verbalize an understanding and resolution of current interpersonal problems ?Learn and implement problem solving and decision making skills ?Learn and implement conflict resolution skills to resolve interpersonal problems ?Verbalize an understanding of healthy and unhealthy emotions verbalize insight into how past relationships may be influence current experiences with depression ?Use mindfulness and acceptance strategies and increase value based behavior  ?Increase hopeful statements about the future.  ?Interventions ?Consistent with treatment model, discuss how change in cognitive, behavioral, and  interpersonal can help client alleviate depression ?CBT ?Behavioral activation help the client explore the relationship, nature of the dispute,  ?Help the client develop new interpersonal skills and relationships ?Conduct Problem so living therapy ?  Teach conflict resolution skills ?Use a process-experiential approach ?Conduct TLDP ?Conduct ACT ?Evaluate need for psychotropic medication ?Monitor adherence to medication  ? ?The patient and clinician reviewed the treatment plan on 02/05/2022. The patient approved of the treatment plan.  ? ? ?Conception Chancy, PsyD ? ? ? ?

## 2022-02-05 NOTE — Progress Notes (Signed)
                Abran Gavigan, PsyD 

## 2022-02-16 ENCOUNTER — Other Ambulatory Visit: Payer: Self-pay | Admitting: Family Medicine

## 2022-03-05 DIAGNOSIS — R972 Elevated prostate specific antigen [PSA]: Secondary | ICD-10-CM | POA: Diagnosis not present

## 2022-03-12 ENCOUNTER — Ambulatory Visit (INDEPENDENT_AMBULATORY_CARE_PROVIDER_SITE_OTHER): Payer: Medicare HMO | Admitting: *Deleted

## 2022-03-12 ENCOUNTER — Ambulatory Visit: Payer: Medicare HMO | Admitting: Psychologist

## 2022-03-12 DIAGNOSIS — Z0000 Encounter for general adult medical examination without abnormal findings: Secondary | ICD-10-CM

## 2022-03-12 DIAGNOSIS — N5201 Erectile dysfunction due to arterial insufficiency: Secondary | ICD-10-CM | POA: Diagnosis not present

## 2022-03-12 DIAGNOSIS — N401 Enlarged prostate with lower urinary tract symptoms: Secondary | ICD-10-CM | POA: Diagnosis not present

## 2022-03-12 DIAGNOSIS — R351 Nocturia: Secondary | ICD-10-CM | POA: Diagnosis not present

## 2022-03-12 DIAGNOSIS — R972 Elevated prostate specific antigen [PSA]: Secondary | ICD-10-CM | POA: Diagnosis not present

## 2022-03-12 NOTE — Patient Instructions (Signed)
Keith [name] , ?Thank you for taking time to come for your Medicare Wellness Visit. I appreciate your ongoing commitment to your health goals. Please review the following plan we discussed and let me know if I can assist you in the future.  ? ?Screening recommendations/referrals: ?Colonoscopy: up to date  repeat 3 years ?Recommended yearly ophthalmology/optometry visit for glaucoma screening and checkup ?Recommended yearly dental visit for hygiene and checkup ? ?Vaccinations: ?Influenza vaccine: up to date ?Pneumococcal vaccine: up to date ?Tdap vaccine: up to date Due 08-2022 ?Shingles vaccine: up to date   ? ?Advanced directives: on file ? ? ? ?Preventive Care 76 Years and Older, Male ?Preventive care refers to lifestyle choices and visits with your health care provider that can promote health and wellness. ?What does preventive care include? ?A yearly physical exam. This is also called an annual well check. ?Dental exams once or twice a year. ?Routine eye exams. Ask your health care provider how often you should have your eyes checked. ?Personal lifestyle choices, including: ?Daily care of your teeth and gums. ?Regular physical activity. ?Eating a healthy diet. ?Avoiding tobacco and drug use. ?Limiting alcohol use. ?Practicing safe sex. ?Taking low doses of aspirin every day. ?Taking vitamin and mineral supplements as recommended by your health care provider. ?What happens during an annual well check? ?The services and screenings done by your health care provider during your annual well check will depend on your age, overall health, lifestyle risk factors, and family history of disease. ?Counseling  ?Your health care provider may ask you questions about your: ?Alcohol use. ?Tobacco use. ?Drug use. ?Emotional well-being. ?Home and relationship well-being. ?Sexual activity. ?Eating habits. ?History of falls. ?Memory and ability to understand (cognition). ?Work and work Statistician. ?Screening  ?You may have the  following tests or measurements: ?Height, weight, and BMI. ?Blood pressure. ?Lipid and cholesterol levels. These may be checked every 5 years, or more frequently if you are over 76 years old. ?Skin check. ?Lung cancer screening. You may have this screening every year starting at age 76 if you have a 30-pack-year history of smoking and currently smoke or have quit within the past 15 years. ?Fecal occult blood test (FOBT) of the stool. You may have this test every year starting at age 76. ?Flexible sigmoidoscopy or colonoscopy. You may have a sigmoidoscopy every 5 years or a colonoscopy every 10 years starting at age 76. ?Prostate cancer screening. Recommendations will vary depending on your family history and other risks. ?Hepatitis C blood test. ?Hepatitis B blood test. ?Sexually transmitted disease (STD) testing. ?Diabetes screening. This is done by checking your blood sugar (glucose) after you have not eaten for a while (fasting). You may have this done every 1-3 years. ?Abdominal aortic aneurysm (AAA) screening. You may need this if you are a current or former smoker. ?Osteoporosis. You may be screened starting at age 76 if you are at high risk. ?Talk with your health care provider about your test results, treatment options, and if necessary, the need for more tests. ?Vaccines  ?Your health care provider may recommend certain vaccines, such as: ?Influenza vaccine. This is recommended every year. ?Tetanus, diphtheria, and acellular pertussis (Tdap, Td) vaccine. You may need a Td booster every 10 years. ?Zoster vaccine. You may need this after age 41. ?Pneumococcal 13-valent conjugate (PCV13) vaccine. One dose is recommended after age 23. ?Pneumococcal polysaccharide (PPSV23) vaccine. One dose is recommended after age 58. ?Talk to your health care provider about which screenings and vaccines you need  and how often you need them. ?This information is not intended to replace advice given to you by your health care  provider. Make sure you discuss any questions you have with your health care provider. ?Document Released: 11/24/2015 Document Revised: 07/17/2016 Document Reviewed: 08/29/2015 ?Elsevier Interactive Patient Education ? 2017 [employer]. ? ?Fall Prevention in the Home ?Falls can cause injuries. They can happen to people of all ages. There are many things you can do to make your home safe and to help prevent falls. ?What can I do on the outside of my home? ?Regularly fix the edges of walkways and driveways and fix any cracks. ?Remove anything that might make you trip as you walk through a door, such as a raised step or threshold. ?Trim any bushes or trees on the path to your home. ?Use bright outdoor lighting. ?Clear any walking paths of anything that might make someone trip, such as rocks or tools. ?Regularly check to see if handrails are loose or broken. Make sure that both sides of any steps have handrails. ?Any raised decks and porches should have guardrails on the edges. ?Have any leaves, snow, or ice cleared regularly. ?Use sand or salt on walking paths during winter. ?Clean up any spills in your garage right away. This includes oil or grease spills. ?What can I do in the bathroom? ?Use night lights. ?Install grab bars by the toilet and in the tub and shower. Do not use towel bars as grab bars. ?Use non-skid mats or decals in the tub or shower. ?If you need to sit down in the shower, use a plastic, non-slip stool. ?Keep the floor dry. Clean up any water that spills on the floor as soon as it happens. ?Remove soap buildup in the tub or shower regularly. ?Attach bath mats securely with double-sided non-slip rug tape. ?Do not have throw rugs and other things on the floor that can make you trip. ?What can I do in the bedroom? ?Use night lights. ?Make sure that you have a light by your bed that is easy to reach. ?Do not use any sheets or blankets that are too big for your bed. They should not hang down onto the  floor. ?Have a firm chair that has side arms. You can use this for support while you get dressed. ?Do not have throw rugs and other things on the floor that can make you trip. ?What can I do in the kitchen? ?Clean up any spills right away. ?Avoid walking on wet floors. ?Keep items that you use a lot in easy-to-reach places. ?If you need to reach something above you, use a strong step stool that has a grab bar. ?Keep electrical cords out of the way. ?Do not use floor polish or wax that makes floors slippery. If you must use wax, use non-skid floor wax. ?Do not have throw rugs and other things on the floor that can make you trip. ?What can I do with my stairs? ?Do not leave any items on the stairs. ?Make sure that there are handrails on both sides of the stairs and use them. Fix handrails that are broken or loose. Make sure that handrails are as long as the stairways. ?Check any carpeting to make sure that it is firmly attached to the stairs. Fix any carpet that is loose or worn. ?Avoid having throw rugs at the top or bottom of the stairs. If you do have throw rugs, attach them to the floor with carpet tape. ?Make sure that you  have a light switch at the top of the stairs and the bottom of the stairs. If you do not have them, ask someone to add them for you. ?What else can I do to help prevent falls? ?Wear shoes that: ?Do not have high heels. ?Have rubber bottoms. ?Are comfortable and fit you well. ?Are closed at the toe. Do not wear sandals. ?If you use a stepladder: ?Make sure that it is fully opened. Do not climb a closed stepladder. ?Make sure that both sides of the stepladder are locked into place. ?Ask someone to hold it for you, if possible. ?Clearly mark and make sure that you can see: ?Any grab bars or handrails. ?First and last steps. ?Where the edge of each step is. ?Use tools that help you move around (mobility aids) if they are needed. These include: ?Canes. ?Walkers. ?Scooters. ?Crutches. ?Turn on the  lights when you go into a dark area. Replace any light bulbs as soon as they burn out. ?Set up your furniture so you have a clear path. Avoid moving your furniture around. ?If any of your floors are uneven, fix t
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
## 2022-03-13 ENCOUNTER — Other Ambulatory Visit: Payer: Self-pay | Admitting: Family Medicine

## 2022-03-22 DIAGNOSIS — I10 Essential (primary) hypertension: Secondary | ICD-10-CM | POA: Diagnosis not present

## 2022-03-22 DIAGNOSIS — R140 Abdominal distension (gaseous): Secondary | ICD-10-CM | POA: Diagnosis not present

## 2022-03-23 ENCOUNTER — Encounter (HOSPITAL_BASED_OUTPATIENT_CLINIC_OR_DEPARTMENT_OTHER): Payer: Self-pay

## 2022-03-23 ENCOUNTER — Emergency Department (HOSPITAL_BASED_OUTPATIENT_CLINIC_OR_DEPARTMENT_OTHER)
Admission: EM | Admit: 2022-03-23 | Discharge: 2022-03-23 | Disposition: A | Discharge status: Home / Self Care | Payer: Medicare HMO | Attending: Emergency Medicine | Admitting: Emergency Medicine

## 2022-03-23 ENCOUNTER — Emergency Department (HOSPITAL_BASED_OUTPATIENT_CLINIC_OR_DEPARTMENT_OTHER): Payer: Medicare HMO

## 2022-03-23 DIAGNOSIS — I10 Essential (primary) hypertension: Secondary | ICD-10-CM | POA: Insufficient documentation

## 2022-03-23 DIAGNOSIS — R1084 Generalized abdominal pain: Secondary | ICD-10-CM | POA: Diagnosis not present

## 2022-03-23 DIAGNOSIS — Z79899 Other long term (current) drug therapy: Secondary | ICD-10-CM | POA: Diagnosis not present

## 2022-03-23 DIAGNOSIS — R110 Nausea: Secondary | ICD-10-CM | POA: Diagnosis not present

## 2022-03-23 DIAGNOSIS — K8500 Idiopathic acute pancreatitis without necrosis or infection: Secondary | ICD-10-CM | POA: Diagnosis not present

## 2022-03-23 DIAGNOSIS — R188 Other ascites: Secondary | ICD-10-CM | POA: Diagnosis not present

## 2022-03-23 DIAGNOSIS — N281 Cyst of kidney, acquired: Secondary | ICD-10-CM | POA: Diagnosis not present

## 2022-03-23 DIAGNOSIS — Z7982 Long term (current) use of aspirin: Secondary | ICD-10-CM | POA: Diagnosis not present

## 2022-03-23 LAB — CBC
HCT: 44.5 % (ref 39.0–52.0)
Hemoglobin: 15.7 g/dL (ref 13.0–17.0)
MCH: 30.9 pg (ref 26.0–34.0)
MCHC: 35.3 g/dL (ref 30.0–36.0)
MCV: 87.6 fL (ref 80.0–100.0)
Platelets: 283 10*3/uL (ref 150–400)
RBC: 5.08 MIL/uL (ref 4.22–5.81)
RDW: 12.2 % (ref 11.5–15.5)
WBC: 8.7 10*3/uL (ref 4.0–10.5)
nRBC: 0 % (ref 0.0–0.2)

## 2022-03-23 LAB — LIPASE, BLOOD: Lipase: 130 U/L — ABNORMAL HIGH (ref 11–51)

## 2022-03-23 LAB — URINALYSIS, ROUTINE W REFLEX MICROSCOPIC
Bilirubin Urine: NEGATIVE
Glucose, UA: NEGATIVE mg/dL
Ketones, ur: NEGATIVE mg/dL
Leukocytes,Ua: NEGATIVE
Nitrite: NEGATIVE
Specific Gravity, Urine: 1.013 (ref 1.005–1.030)
pH: 6 (ref 5.0–8.0)

## 2022-03-23 LAB — COMPREHENSIVE METABOLIC PANEL
ALT: 13 U/L (ref 0–44)
AST: 22 U/L (ref 15–41)
Albumin: 4.3 g/dL (ref 3.5–5.0)
Alkaline Phosphatase: 45 U/L (ref 38–126)
Anion gap: 9 (ref 5–15)
BUN: 18 mg/dL (ref 8–23)
CO2: 25 mmol/L (ref 22–32)
Calcium: 9 mg/dL (ref 8.9–10.3)
Chloride: 103 mmol/L (ref 98–111)
Creatinine, Ser: 1.24 mg/dL (ref 0.61–1.24)
GFR, Estimated: >60 mL/min (ref >60)
Glucose, Bld: 100 mg/dL — ABNORMAL HIGH (ref 70–99)
Potassium: 3.8 mmol/L (ref 3.5–5.1)
Sodium: 137 mmol/L (ref 135–145)
Total Bilirubin: 1 mg/dL (ref 0.3–1.2)
Total Protein: 6.9 g/dL (ref 6.5–8.1)

## 2022-03-23 NOTE — Discharge Instructions (Signed)
Continue medications as previously prescribed. ? ?Clear liquid diet for the next 2 days, then advance to bland diet as tolerated. ? ?Return to the emergency department for worsening pain, uncontrollable vomiting, high fevers, bloody stools, or for other new and concerning symptoms. ?

## 2022-03-23 NOTE — ED Notes (Signed)
ED Provider at bedside. 

## 2022-03-23 NOTE — ED Notes (Signed)
Pt agreeable wit hd/c plan as discussed by provider- this nurse has verbally reinforced d/c instructions and provided pt with written copy - pt acknowledges verbal understanding and denies any additional questions, concerns, needs -- ambulatory with steady gait at d/c to lobby to await ride; no distress; vitals stable.  ?

## 2022-03-23 NOTE — ED Provider Notes (Signed)
?[name] ?Provider Note ? ? ?CSN: [id number] ?Arrival date & time: 03/23/22  0011 ? ?  ? ?History ? ?Chief Complaint  ?Patient presents with  ? Abdominal Pain  ? ? 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 ? 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  Patient presenting today with complaints of abdominal pain.  He describes generalized abdominal pain that started 2 evenings ago and has worsened since.  He feels as though his abdomen is more distended.  He denies vomiting, but does describe some mild nausea.  He denies diarrhea or constipation.  He denies bloody stool or vomit.  He denies any fevers or chills.  Pain seems to be somewhat worse with eating. ? ?The history is provided by the patient.  ? ?  ? ?Home Medications ?Prior to Admission medications   ?Medication Sig Start Date End Date Taking? Authorizing Provider  ?amLODipine (NORVASC) 10 MG tablet TAKE 1 TABLET BY MOUTH DAILY 03/13/22   Tonia Ghent, MD  ?Ascorbic Acid (VITAMIN C) 1000 MG tablet Take 1,000 mg by mouth in the morning.    [provider]  ?aspirin EC 81 MG tablet Take 81 mg by mouth daily. Swallow whole.    [provider]  ?buPROPion (WELLBUTRIN XL) 150 MG 24 hr tablet TAKE 3 TABLETS BY MOUTH DAILY. 02/18/22   Tonia Ghent, MD  ?Cholecalciferol (VITAMIN D3) 5000 UNITS TABS Take 5,000 Units by mouth in the morning.    [provider]  ?folic acid (FOLVITE) 361 MCG tablet Take 800 mcg by mouth daily.     [provider]  ?ibuprofen (ADVIL) 200 MG tablet Take 800 mg by mouth every 8 (eight) hours as needed (pain.).    [provider]  ?metoprolol succinate (TOPROL-XL) 50 MG 24 hr tablet TAKE 2 TABLETS BY MOUTH DAILY , WITH OR IMMEDIATELY FOLLOWING A MEAL 03/13/22   Tonia Ghent, MD  ?omeprazole (PRILOSEC) 20 MG capsule Take 20 mg by mouth daily as needed (acid  reflux/indigestion.).    [provider]  ?rosuvastatin (CRESTOR) 10 MG tablet Take 1 tablet (10 mg total) by mouth 2 (two) times a week. Mondays & Fridays. 01/28/22   Tonia Ghent, MD  ?tadalafil (CIALIS) 20 MG tablet Take 1 tablet (20 mg total) by mouth 3 (three) times a week. For BPH. ?Patient taking differently: Take 20 mg by mouth every Tuesday, Thursday, and Saturday at 6 PM. For BPH. 11/28/21   Tonia Ghent, MD  ?   ? ?Allergies    ?Ivp dye [iodinated contrast media], Effexor [venlafaxine], Flomax [tamsulosin hcl], Fluoxetine, Lipitor [atorvastatin], Sertraline, and Zocor [simvastatin]   ? ?Review of Systems   ?Review of Systems  ?All other systems reviewed and are negative. ? ?Physical Exam ?Updated Vital Signs ?BP (!) 146/63 (BP Location: Right Arm)   Pulse (!) 52   Temp 98.1 ?F (36.7 ?C) (Oral)   Resp 19   SpO2 96%  ?Physical Exam ?Vitals and nursing note reviewed.  ?Constitutional:   ?   General: He is not in acute distress. ?   Appearance: He is well-developed. He is not diaphoretic.  ?HENT:  ?   Head: Normocephalic and atraumatic.  ?Cardiovascular:  ?   Rate and Rhythm: Normal rate and regular rhythm.  ?   Heart sounds: No murmur heard. ?  No friction rub.  ?Pulmonary:  ?  Effort: Pulmonary effort is normal. No respiratory distress.  ?   Breath sounds: Normal breath sounds. No wheezing or rales.  ?Abdominal:  ?   General: Bowel sounds are normal. There is no distension.  ?   Palpations: Abdomen is soft.  ?   Tenderness: There is generalized abdominal tenderness. There is no right CVA tenderness, left CVA tenderness, guarding or rebound.  ?Musculoskeletal:     ?   General: Normal range of motion.  ?   Cervical back: Normal range of motion and neck supple.  ?Skin: ?   General: Skin is warm and dry.  ?Neurological:  ?   Mental Status: He is alert and oriented to person, place, and time.  ?   Coordination: Coordination normal.  ? ? ?ED Results / Procedures / Treatments   ?Labs ?(all labs  ordered are listed, but only abnormal results are displayed) ?Labs Reviewed  ?LIPASE, BLOOD - Abnormal; Notable for the following components:  ?    Result Value  ? Lipase 130 (*)   ? All other components within normal limits  ?COMPREHENSIVE METABOLIC PANEL - Abnormal; Notable for the following components:  ? Glucose, Bld 100 (*)   ? All other components within normal limits  ?URINALYSIS, ROUTINE W REFLEX MICROSCOPIC - Abnormal; Notable for the following components:  ? Hgb urine dipstick TRACE (*)   ? Protein, ur TRACE (*)   ? All other components within normal limits  ?CBC  ? ? ?EKG ?None ? ?Radiology ?No results found. ? ?Procedures ?Procedures  ? ? ?Medications Ordered in ED ?Medications - No data to display ? ?ED Course/ Medical Decision Making/ A&P ? ?This patient presents to the ED for concern of abdominal pain, this involves an extensive number of treatment options, and is a complaint that carries with it a high risk of complications and morbidity.  The differential diagnosis includes small bowel obstruction, acute cholecystitis, pancreatitis, colitis, appendicitis ? ? ?Co morbidities that complicate the patient evaluation ? ?None ? ? ?Additional history obtained: ? ?No additional history or external records needed ? ? ?Lab Tests: ? ?I Ordered, and personally interpreted labs.  The pertinent results include: Unremarkable CBC and metabolic panel.  LFTs are unremarkable, but there is a mild elevation of lipase at 130 ? ? ?Imaging Studies ordered: ? ?I ordered imaging studies including CT scan of the abdomen and pelvis ?I independently visualized and interpreted imaging which showed trace perihepatic ascites, however no other acute abnormality ?I agree with the radiologist interpretation ? ? ?Cardiac Monitoring: / EKG: ? ?None obtained ? ? ?Consultations Obtained: ? ?No consultations indicated ? ? ?Problem List / ED Course / Critical interventions / Medication management ? ?Patient presenting with generalized  abdominal discomfort for the past 1-1/2 days.  Laboratory studies are remarkable for a lipase of 130, but are otherwise normal.  CT scan was obtained without contrast due to his contrast allergy.  This showed trace perihepatic ascites, the significance of which I am uncertain.  There is no evidence for bowel obstruction or other obvious abnormality.  At this point, it appears as though patient may be experiencing an episode of idiopathic pancreatitis as he does not consume alcohol and there is no evidence for gallstone.  At this point, patient seems appropriate for discharge.  I have advised him to adhere to a clear liquid diet for the next 2 days and we will see how things proceed.  He is to return as needed if symptoms worsen. ?No medications ordered  were given.  Patient is declining pain medication which was offered. ?I have reviewed the patients home medicines and have made adjustments as needed ? ? ?Social Determinants of Health: ? ?None ? ? ?Test / Admission - Considered: ? ?Patient seems appropriate for discharge.  He is to follow-up with primary doctor if not improving. ? ? ?Final Clinical Impression(s) / ED Diagnoses ?Final diagnoses:  ?None  ? ? ?Rx / DC Orders ?ED Discharge Orders   ? ? None  ? ?  ? ? ?  ?Veryl Speak, MD ?03/23/22 [phone] ? ?

## 2022-03-23 NOTE — ED Notes (Signed)
Patient transported to CT w/c escorted by CT tech  ?

## 2022-03-23 NOTE — ED Notes (Signed)
Pt now returned from CT dept via w/c - awake and alert; no acute changes  

## 2022-03-23 NOTE — ED Triage Notes (Signed)
Pt arrives from home via ems -- pt c/o generalized abd pain with associated dyspnea upon exertion, gas discomfort and  intermittent constipation -- pt does also report having bowel movements x2 within 24 hours prior to arrival.  Denies n/v/d.  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  H/O A-Fib and Vtach -- followed by cardiologist Dr Einar Gip; enroute pt SB - SR on cardiac monitor with intermittent PVCs.    ?

## 2022-03-24 ENCOUNTER — Telehealth: Payer: Self-pay | Admitting: Family Medicine

## 2022-03-24 DIAGNOSIS — R748 Abnormal levels of other serum enzymes: Secondary | ICD-10-CM

## 2022-03-24 NOTE — Telephone Encounter (Signed)
Please get update on patient re: abd pain.  We should likely recheck his lipase when he is feeling better.  I put in the order but it doesn't have to be collected at our clinic.  Thanks.  ?

## 2022-03-25 ENCOUNTER — Other Ambulatory Visit: Payer: Self-pay | Admitting: Gastroenterology

## 2022-03-25 DIAGNOSIS — R748 Abnormal levels of other serum enzymes: Secondary | ICD-10-CM

## 2022-03-25 NOTE — Telephone Encounter (Signed)
Patient is starting to feel better but not completely. Advised patient we need to recheck labs when he is feeling better. Does he need to be completely better before done or is it okay to do now?  ?

## 2022-03-26 DIAGNOSIS — R748 Abnormal levels of other serum enzymes: Secondary | ICD-10-CM | POA: Diagnosis not present

## 2022-03-26 NOTE — Telephone Encounter (Signed)
The main issue is that the patient is improving clinically.  I think it makes sense to recheck his lipase whenever possible, either now or in the next few days.  Patient is reportedly going to follow-up with GI and I think that is reasonable. ? ?If needed, please send a letter to patient for the lab. ? ?Serum lipase, dx Serum lipase elevation [R74.8]  ? ?Thanks.  ?

## 2022-03-26 NOTE — Telephone Encounter (Signed)
Orders have been faxed to patient.  ?

## 2022-03-26 NOTE — Addendum Note (Signed)
Addended by: Sherrilee Gilles B on: 03/26/2022 10:16 AM ? ? Modules accepted: Orders ? ?

## 2022-03-31 ENCOUNTER — Ambulatory Visit
Admission: RE | Admit: 2022-03-31 | Discharge: 2022-03-31 | Disposition: A | Discharge status: Home / Self Care | Payer: Medicare HMO | Source: Ambulatory Visit | Attending: Gastroenterology | Admitting: Gastroenterology

## 2022-03-31 DIAGNOSIS — K859 Acute pancreatitis without necrosis or infection, unspecified: Secondary | ICD-10-CM | POA: Diagnosis not present

## 2022-03-31 DIAGNOSIS — R748 Abnormal levels of other serum enzymes: Secondary | ICD-10-CM

## 2022-03-31 MED ORDER — GADOBENATE DIMEGLUMINE 529 MG/ML IV SOLN
18.0000 mL | Freq: Once | INTRAVENOUS | Status: AC | PRN
  Administered 2022-03-31: 18 mL via INTRAVENOUS

## 2022-04-09 ENCOUNTER — Telehealth: Payer: Self-pay

## 2022-04-09 NOTE — Telephone Encounter (Signed)
I spoke wth pt and he said + covid on 04/08/22. Pt said one of his partners at [facility] started pt on molnupiravir on 04/08/22 and pt actually feels better today. Pt is not SOB but does have dry cough; pulse ox is staying 97%. UC &ED precautions given and pt voiced understanding and pt will call [facility] back if needed. Sending note to Dr Damita Dunnings and Janett Billow CMA.

## 2022-04-09 NOTE — Telephone Encounter (Signed)
[place] Night - Client TELEPHONE ADVICE RECORD [facility] Patient Name: Keith [name] Gender: Male DOB: December 08, 1945 Age: 76 Y 3 M 14 D Return Phone Number: [phone] (Primary) Address: City/ State/ Zip: [place]  [address] Client [facility] [facility] - Client Client Site [facility] Provider Renford Dills - MD Contact Type Call Who Is Calling Patient / Member / Family / Caregiver Call Type Triage / Clinical Relationship To Patient Self Return Phone Number [phone] (Primary) Chief Complaint BREATHING - shortness of breath or sounds breathless Reason for Call Symptomatic / Request for Health Information Initial Comment Caller has COVID, needs medications, nasal congestion, headaches, coughing, shortness of breath. Translation No Nurse Assessment Nurse: Martyn Ehrich, RN, Felicia Date/Time (Eastern Time): 04/08/2022 10:06:11 AM Confirm and document reason for call. If symptomatic, describe symptoms. ---Pt has covid symptoms onset yesterday. He tested pos for covid this am. He has HA cough and congestion. Denies SOB. No fever. Does the patient have any new or worsening symptoms? ---Yes Will a triage be completed? ---Yes Related visit to physician within the last 2 weeks? ---No Does the PT have any chronic conditions? (i.e. diabetes, asthma, this includes High risk factors for pregnancy, etc.) ---Yes List chronic conditions. ---htn high lipids Is this a behavioral health or substance abuse call? ---No Guidelines Guideline Title Affirmed Question Affirmed Notes Nurse Date/Time (Eastern Time) COVID-19 - Diagnosed or Suspected [1] HIGH RISK patient (e.g., weak immune system, age > 80 years, obesity with BMI 30 or higher, pregnant, chronic lung disease or other chronic medical condition) Gaddy, RN, Felicia 7/79/3903 00:92:33 AM PLEASE NOTE: All timestamps contained within this  report are represented as [place] Standard Time. CONFIDENTIALTY NOTICE: This fax transmission is intended only for the addressee. It contains information that is legally privileged, confidential or otherwise protected from use or disclosure. If you are not the intended recipient, you are strictly prohibited from reviewing, disclosing, copying using or disseminating any of this information or taking any action in reliance on or regarding this information. If you have received this fax in error, please notify us immediately by telephone so that we can arrange for its return to [name]. Phone: [phone], Toll-Free: [phone], Fax: [phone] Page: 2 of 2 Call Id: [id number] Guidelines Guideline Title Affirmed Question Affirmed Notes Nurse Date/Time Eilene Ghazi Time) AND [2] COVID symptoms (e.g., cough, fever) (Exceptions: Already seen by PCP and no new or worsening symptoms.) Disp. Time Eilene Ghazi Time) Disposition Final User 04/08/2022 10:04:20 AM Send to Urgent Clarnce Flock 04/08/2022 10:12:56 AM Call PCP within 24 Hours Yes Martyn Ehrich, RN, Solmon Ice Caller Disagree/Comply Comply Caller Understands Yes PreDisposition Did not know what to do Care Advice Given Per Guideline CALL PCP WITHIN 24 HOURS: * Telemedicine may be your best choice for care during this COVID-19 outbreak. * You should call a telemedicine doctor (or NP/PA) within the next 24 hours, if your own doctor is not available. * Cough: Use cough drops. * Feeling dehydrated: Drink extra liquids. If the air in your home is dry, use a humidifier. * Fever, Chills, and Sweats: For fever over 101 F (38.3 C), take acetaminophen every 4 to 6 hours (Adults 650 mg) OR ibuprofen every 6 to 8 hours (Adults 400 mg). Before taking any medicine, read all the instructions on the package. Do not take aspirin unless your doctor has prescribed it for you. Chills can sometimes come before a fever. You may feel cold in your hands and feet.  You may have  shivering. You may also feel sweaty as your body temperature goes down. * For fevers above 101 F (38.3 C) take either acetaminophen or ibuprofen. * You become worse * STAY HOME A MINIMUM OF 5 DAYS: People with MILD COVID-19 can STOP HOME ISOLATION AFTER 5 DAYS if (1) fever has been gone for 24 hours (without using fever medicine) AND (2) symptoms are better. Continue to wear a well-fitted mask for a full 10 days when around others. * WEAR A MASK FOR 10 DAYS: Wear a well-fitted mask for 10 full days any time you are around others inside your home or in public. Do not go to places where you are unable to wear a mask

## 2022-04-10 NOTE — Telephone Encounter (Signed)
Called patient.  He feels better today but not back to baseline.  We both agreed that since he was improving that was a good sign and neither of [name] expected him to be completely asymptomatic yet.  He is tolerating molnupiravir.  He can update me as needed.  His appetite is down but he still taking fluids.  We talked about his lipase elevation that resolved.  He had follow-up imaging per GI without any significant abnormality.  I question if he had microlithiasis or an isolated gallstone that already passed by the time his imaging was done.  I would not suspect a drug effect.  He does not drink alcohol and his triglycerides are now significantly elevated.  He is asymptomatic at this point and it does not appear that he needs any follow-up imaging or extra work-up.  He can update me as needed.  I thanked him for taking the call.

## 2022-05-27 ENCOUNTER — Encounter: Payer: Self-pay | Admitting: Family Medicine

## 2022-05-27 ENCOUNTER — Ambulatory Visit (INDEPENDENT_AMBULATORY_CARE_PROVIDER_SITE_OTHER): Payer: Medicare HMO | Admitting: Family Medicine

## 2022-05-27 VITALS — BP 124/62 | HR 51 | Temp 97.8°F | Ht 69.0 in | Wt 195.0 lb

## 2022-05-27 DIAGNOSIS — R5383 Other fatigue: Secondary | ICD-10-CM

## 2022-05-27 LAB — COMPREHENSIVE METABOLIC PANEL
ALT: 14 U/L (ref 0–53)
AST: 23 U/L (ref 0–37)
Albumin: 4.3 g/dL (ref 3.5–5.2)
Alkaline Phosphatase: 47 U/L (ref 39–117)
BUN: 15 mg/dL (ref 6–23)
CO2: 28 mEq/L (ref 19–32)
Calcium: 9.5 mg/dL (ref 8.4–10.5)
Chloride: 102 mEq/L (ref 96–112)
Creatinine, Ser: 1.1 mg/dL (ref 0.40–1.50)
GFR: 65.25 mL/min (ref >60.00)
Glucose, Bld: 79 mg/dL (ref 70–99)
Potassium: 4 mEq/L (ref 3.5–5.1)
Sodium: 137 mEq/L (ref 135–145)
Total Bilirubin: 0.7 mg/dL (ref 0.2–1.2)
Total Protein: 6.9 g/dL (ref 6.0–8.3)

## 2022-05-27 LAB — CBC WITH DIFFERENTIAL/PLATELET
Basophils Absolute: 0.1 10*3/uL (ref 0.0–0.1)
Basophils Relative: 1.1 % (ref 0.0–3.0)
Eosinophils Absolute: 0.2 10*3/uL (ref 0.0–0.7)
Eosinophils Relative: 2.2 % (ref 0.0–5.0)
HCT: 45.1 % (ref 39.0–52.0)
Hemoglobin: 15.5 g/dL (ref 13.0–17.0)
Lymphocytes Relative: 25.1 % (ref 12.0–46.0)
Lymphs Abs: 2.1 10*3/uL (ref 0.7–4.0)
MCHC: 34.4 g/dL (ref 30.0–36.0)
MCV: 89.8 fl (ref 78.0–100.0)
Monocytes Absolute: 0.9 10*3/uL (ref 0.1–1.0)
Monocytes Relative: 10.1 % (ref 3.0–12.0)
Neutro Abs: 5.2 10*3/uL (ref 1.4–7.7)
Neutrophils Relative %: 61.5 % (ref 43.0–77.0)
Platelets: 287 10*3/uL (ref 150.0–400.0)
RBC: 5.03 Mil/uL (ref 4.22–5.81)
RDW: 13.8 % (ref 11.5–15.5)
WBC: 8.5 10*3/uL (ref 4.0–10.5)

## 2022-05-27 LAB — TSH: TSH: 1.48 u[IU]/mL (ref 0.35–5.50)

## 2022-05-27 NOTE — Patient Instructions (Signed)
Go to the lab on the way out.   If you have mychart we'll likely use that to update you.    Take care.  Glad to see you. Don't change your meds for now.  Check with Dr. Einar Gip.

## 2022-05-27 NOTE — Progress Notes (Unsigned)
Fatigue.  Bradycardia likely due to fitness and beta blocker.  Sx noted since covid about 6 weeks ago.  Loses motivation.  He had brain fog with covid, at the time of illness, though that resolved in the meantime.    We talked about his mood, ie depression vs disappointment.  He is in a relationship with Vaughan Basta but she has a rigid schedule and he has been disappointed in some of her responses previously.  D/w pt.  No SI/HI.    No CP, SOB, BLE edema.  No SI/HI.  Not lightheaded on standing.  Nocturia with sleep disruption.  No known apnea.    He has R shoulder pain and will follow up with ortho.    Meds, vitals, and allergies reviewed.   ROS: Per HPI unless specifically indicated in ROS section   GEN: nad, alert and oriented HEENT: ncat NECK: supple w/o LA CV: rrr.   PULM: ctab, no inc wob ABD: soft, +bs EXT: Trace BLE edema.   SKIN: Well-perfused. He has pain on right shoulder abduction but full exam deferred.

## 2022-05-29 DIAGNOSIS — R5383 Other fatigue: Secondary | ICD-10-CM | POA: Insufficient documentation

## 2022-05-29 NOTE — Assessment & Plan Note (Signed)
This could be multifactorial, with social stressors noted, recent illness, and potentially medication contribution with beta-blocker induced bradycardia.  He is going to follow-up with cardiology.  We agreed it was reasonable to check routine labs today before we make any other changes.  At this point still okay for outpatient follow-up.  He agrees with plan.

## 2022-06-10 ENCOUNTER — Telehealth: Payer: Self-pay

## 2022-06-10 DIAGNOSIS — K859 Acute pancreatitis without necrosis or infection, unspecified: Secondary | ICD-10-CM | POA: Diagnosis not present

## 2022-06-10 DIAGNOSIS — R140 Abdominal distension (gaseous): Secondary | ICD-10-CM

## 2022-06-10 NOTE — Telephone Encounter (Signed)
I put in the orders for cbc cmet amylase and lipase.  If worse then let me know.  I would get on a clear liquid diet in the meantime if he hasn't already done that.  Please get update on patient tomorrow.  Thanks.

## 2022-06-10 NOTE — Telephone Encounter (Signed)
Patient states that he thinks he may be having another pancreatitis flare. States that his belly was suddenly distended and painful last night and he had a low grade fever of 99.5. He was not nauseated but the idea of food was awful. He is a little better this morning - less distention and pain but he is having some unusual back pain. States it feels like his trainer worked him too hard. He states that he feels like the only thing anyone would find if they examined him is his belly is a bit distended and tender but nothing else. He wants to know if Dr. Damita Dunnings would order an amylase and lipase (and anything else you feel may be necessary) and he can go by his office to have it done?

## 2022-06-10 NOTE — Addendum Note (Signed)
Addended by: Tonia Ghent on: 06/10/2022 08:52 AM   Modules accepted: Orders

## 2022-06-10 NOTE — Telephone Encounter (Signed)
Patient has been advised and states he is only drinking water as everything else is nasty. Lab orders have been printed and faxed to his office to labs to be drawn.

## 2022-06-12 DIAGNOSIS — M25511 Pain in right shoulder: Secondary | ICD-10-CM | POA: Diagnosis not present

## 2022-06-12 DIAGNOSIS — M75101 Unspecified rotator cuff tear or rupture of right shoulder, not specified as traumatic: Secondary | ICD-10-CM | POA: Diagnosis not present

## 2022-06-16 ENCOUNTER — Other Ambulatory Visit: Payer: Self-pay | Admitting: Family Medicine

## 2022-06-19 DIAGNOSIS — M25511 Pain in right shoulder: Secondary | ICD-10-CM | POA: Diagnosis not present

## 2022-06-26 DIAGNOSIS — Z961 Presence of intraocular lens: Secondary | ICD-10-CM | POA: Diagnosis not present

## 2022-06-26 DIAGNOSIS — H532 Diplopia: Secondary | ICD-10-CM | POA: Diagnosis not present

## 2022-06-26 DIAGNOSIS — H5022 Vertical strabismus, left eye: Secondary | ICD-10-CM | POA: Diagnosis not present

## 2022-06-26 DIAGNOSIS — H43811 Vitreous degeneration, right eye: Secondary | ICD-10-CM | POA: Diagnosis not present

## 2022-06-26 DIAGNOSIS — H40013 Open angle with borderline findings, low risk, bilateral: Secondary | ICD-10-CM | POA: Diagnosis not present

## 2022-06-26 DIAGNOSIS — H57812 Brow ptosis, left: Secondary | ICD-10-CM | POA: Diagnosis not present

## 2022-07-03 DIAGNOSIS — M25511 Pain in right shoulder: Secondary | ICD-10-CM | POA: Diagnosis not present

## 2022-07-10 DIAGNOSIS — M25511 Pain in right shoulder: Secondary | ICD-10-CM | POA: Diagnosis not present

## 2022-07-11 ENCOUNTER — Ambulatory Visit: Payer: Medicare HMO | Admitting: Cardiology

## 2022-07-11 ENCOUNTER — Encounter: Payer: Self-pay | Admitting: Cardiology

## 2022-07-11 VITALS — BP 134/54 | HR 55 | Temp 98.0°F | Resp 16 | Ht 69.0 in | Wt 201.0 lb

## 2022-07-11 DIAGNOSIS — I10 Essential (primary) hypertension: Secondary | ICD-10-CM | POA: Diagnosis not present

## 2022-07-11 DIAGNOSIS — R002 Palpitations: Secondary | ICD-10-CM

## 2022-07-11 DIAGNOSIS — E7800 Pure hypercholesterolemia, unspecified: Secondary | ICD-10-CM
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
## 2022-07-17 DIAGNOSIS — M25511 Pain in right shoulder: Secondary | ICD-10-CM | POA: Diagnosis not present

## 2022-07-22 DIAGNOSIS — R208 Other disturbances of skin sensation: Secondary | ICD-10-CM | POA: Diagnosis not present

## 2022-07-22 DIAGNOSIS — Z08 Encounter for follow-up examination after completed treatment for malignant neoplasm: Secondary | ICD-10-CM | POA: Diagnosis not present

## 2022-07-22 DIAGNOSIS — Z85828 Personal history of other malignant neoplasm of skin: Secondary | ICD-10-CM | POA: Diagnosis not present

## 2022-07-22 DIAGNOSIS — L538 Other specified erythematous conditions: Secondary | ICD-10-CM | POA: Diagnosis not present

## 2022-07-22 DIAGNOSIS — L820 Inflamed seborrheic keratosis: Secondary | ICD-10-CM | POA: Diagnosis not present

## 2022-07-22 DIAGNOSIS — Z789 Other specified health status: Secondary | ICD-10-CM | POA: Diagnosis not present

## 2022-07-22 DIAGNOSIS — L821 Other seborrheic keratosis: Secondary | ICD-10-CM | POA: Diagnosis not present

## 2022-07-22 DIAGNOSIS — L814 Other melanin hyperpigmentation: Secondary | ICD-10-CM | POA: Diagnosis not present

## 2022-07-22 DIAGNOSIS — D1801 Hemangioma of skin and subcutaneous tissue: Secondary | ICD-10-CM | POA: Diagnosis not present

## 2022-07-22 DIAGNOSIS — L298 Other pruritus: Secondary | ICD-10-CM | POA: Diagnosis not present

## 2022-07-24 DIAGNOSIS — M25511 Pain in right shoulder: Secondary | ICD-10-CM | POA: Diagnosis not present

## 2022-07-31 DIAGNOSIS — M25511 Pain in right shoulder: Secondary | ICD-10-CM | POA: Diagnosis not present

## 2022-08-06 DIAGNOSIS — Z23 Encounter for immunization: Secondary | ICD-10-CM | POA: Diagnosis not present

## 2022-08-07 DIAGNOSIS — M25511 Pain in right shoulder: Secondary | ICD-10-CM | POA: Diagnosis not present

## 2022-08-22 DIAGNOSIS — M25511 Pain in right shoulder: Secondary | ICD-10-CM | POA: Diagnosis not present

## 2022-09-09 DIAGNOSIS — H52223 Regular astigmatism, bilateral: Secondary | ICD-10-CM | POA: Diagnosis not present

## 2022-09-09 DIAGNOSIS — Z0100 Encounter for examination of eyes and vision without abnormal findings: Secondary | ICD-10-CM | POA: Diagnosis not present

## 2022-10-16 DIAGNOSIS — Z961 Presence of intraocular lens: Secondary | ICD-10-CM | POA: Diagnosis not present

## 2022-10-16 DIAGNOSIS — H57812 Brow ptosis, left: Secondary | ICD-10-CM | POA: Diagnosis not present

## 2022-10-16 DIAGNOSIS — H532 Diplopia: Secondary | ICD-10-CM | POA: Diagnosis not present

## 2022-10-16 DIAGNOSIS — H40013 Open angle with borderline findings, low risk, bilateral: Secondary | ICD-10-CM | POA: Diagnosis not present

## 2022-10-16 DIAGNOSIS — H43811 Vitreous degeneration, right eye: Secondary | ICD-10-CM | POA: Diagnosis not present

## 2022-10-16 DIAGNOSIS — H26493 Other secondary cataract, bilateral: Secondary | ICD-10-CM | POA: Diagnosis not present

## 2022-10-16 DIAGNOSIS — H5022 Vertical strabismus, left eye: Secondary | ICD-10-CM | POA: Diagnosis not present

## 2022-10-31 DIAGNOSIS — B91 Sequelae of poliomyelitis: Secondary | ICD-10-CM | POA: Diagnosis not present

## 2022-10-31 DIAGNOSIS — R69 Illness, unspecified: Secondary | ICD-10-CM | POA: Diagnosis not present

## 2022-10-31 DIAGNOSIS — D6869 Other thrombophilia: Secondary | ICD-10-CM | POA: Diagnosis not present

## 2022-10-31 DIAGNOSIS — I10 Essential (primary) hypertension: Secondary | ICD-10-CM | POA: Diagnosis not present

## 2022-10-31 DIAGNOSIS — M8960 Osteopathy after poliomyelitis, unspecified site: Secondary | ICD-10-CM | POA: Diagnosis not present

## 2022-10-31 DIAGNOSIS — I472 Ventricular tachycardia, unspecified: Secondary | ICD-10-CM | POA: Diagnosis not present

## 2022-10-31 DIAGNOSIS — E785 Hyperlipidemia, unspecified: Secondary | ICD-10-CM | POA: Diagnosis not present

## 2022-10-31 DIAGNOSIS — F3341 Major depressive disorder, recurrent, in partial remission: Secondary | ICD-10-CM | POA: Diagnosis not present

## 2022-10-31 DIAGNOSIS — Z809 Family history of malignant neoplasm, unspecified: Secondary | ICD-10-CM | POA: Diagnosis not present

## 2022-10-31 DIAGNOSIS — M1990 Unspecified osteoarthritis, unspecified site: Secondary | ICD-10-CM | POA: Diagnosis not present

## 2022-10-31 DIAGNOSIS — N400 Enlarged prostate without lower urinary tract symptoms: Secondary | ICD-10-CM | POA: Diagnosis not present

## 2022-10-31 DIAGNOSIS — I4891 Unspecified atrial fibrillation: Secondary | ICD-10-CM | POA: Diagnosis not present

## 2022-10-31 DIAGNOSIS — N529 Male erectile dysfunction, unspecified: Secondary | ICD-10-CM | POA: Diagnosis not present

## 2022-10-31 DIAGNOSIS — Z008 Encounter for other general examination: Secondary | ICD-10-CM | POA: Diagnosis not present

## 2022-11-18 ENCOUNTER — Encounter: Payer: Self-pay | Admitting: Family Medicine

## 2022-11-18 ENCOUNTER — Ambulatory Visit (INDEPENDENT_AMBULATORY_CARE_PROVIDER_SITE_OTHER): Payer: Medicare HMO | Admitting: Family Medicine

## 2022-11-18 VITALS — BP 130/74 | HR 59 | Temp 97.3°F | Ht 69.0 in | Wt 198.0 lb

## 2022-11-18 DIAGNOSIS — R5383 Other fatigue: Secondary | ICD-10-CM

## 2022-11-18 MED ORDER — TADALAFIL 20 MG PO TABS: ORAL_TABLET | ORAL | Status: DC

## 2022-11-18 NOTE — Patient Instructions (Signed)
Let me check with Dr. Einar Gip in the meantime.  I'll check on psychiatry options.  Take care.  Glad to see you.

## 2022-11-18 NOTE — Progress Notes (Unsigned)
Fatigue.  He had h/o sleep difficulty at baseline, waking from sleep at night.  Then noting nocturia/need to void when he wakes.  He snores.  No known apnea.  Not waking up gasping for air.  Still working, still working out.  Lack of motivation noted, mood is worse in the winter.  Has used extra lighting in the past, seasonally.  No SI/HI.    H/o rhinitis noted.    Discussed his relationship with Vaughan Basta.  She has rigid scheduling, she can be dismissive of his input.    Meds, vitals, and allergies reviewed.   ROS: Per HPI unless specifically indicated in ROS section   GEN: nad, alert and oriented HEENT: ncat NECK: supple w/o LA CV: rrr.  PULM: ctab, no inc wob ABD: soft, +bs EXT: no edema Speech and judgment normal.  Affect normal, at baseline.

## 2022-11-20 ENCOUNTER — Telehealth: Payer: Self-pay | Admitting: Family Medicine

## 2022-11-20 DIAGNOSIS — R5381 Other malaise: Secondary | ICD-10-CM

## 2022-11-20 DIAGNOSIS — R0683 Snoring: Secondary | ICD-10-CM

## 2022-11-20 NOTE — Telephone Encounter (Signed)
Dr. Einar Gip,   I need your input on this kind patient.  I greatly appreciate your help.  In talking with the patient we thought it would be reasonable to get him set up for sleep study to evaluate for the possibility of sleep apnea.  I am unable to get that arranged without sending him to pulmonary.  We can do that, but I did not know if it could be arranged directly through your office.  If you have that capability, then I would greatly appreciate your input.  If you are not able to arrange it, please let me know and I will set it up through pulmonary.  Thank you.

## 2022-11-20 NOTE — Assessment & Plan Note (Signed)
With social stressors noted.  He still exercising and not having chest pain with that.  Unclear if he has sleep apnea.  I am going to check with cardiology to see if they can arrange for sleep apnea testing.  Discussed with patient and he agrees.  Unclear how much his mood contributes and ongoing check on psychiatry referral options.  No change in medications at this point. 30 minutes were devoted to patient care in this encounter (this includes time spent reviewing the patient's file/history, interviewing and examining the patient, counseling/reviewing plan with patient).

## 2022-11-21 NOTE — Telephone Encounter (Signed)
Thanks

## 2022-11-21 NOTE — Telephone Encounter (Signed)
Sure I can. Thank You.

## 2022-11-21 NOTE — Telephone Encounter (Signed)
ICD-10-CM   1. Snoring  R06.83 Ambulatory referral to Sleep Studies    2. Malaise and fatigue  R53.81 Ambulatory referral to Sleep Studies   R53.83      Orders Placed This Encounter  Procedures   Ambulatory referral to Sleep Studies    Referral Priority:   Routine    Referral Type:   Consultation    Referral Reason:   Specialty Services Required    Referred to Provider:   Elmarie Mainland, MD    Number of Visits Requested:   1   D/W Dr. Sheryn Bison.

## 2022-11-21 NOTE — Telephone Encounter (Signed)
Dr. Ashby Dawes ([place]) who used to work in [place] is doing online new visit and does HST. Is that an option? If yes, I can set this up. [facility] has their sleep medicine as well and I can talk to them.?

## 2022-11-21 NOTE — Telephone Encounter (Signed)
Dr. Einar Gip- thanks for the input.  My understanding is that Dr. Ashby Dawes has moved.  Can this be set up through your sleep department at [facility]?  Thanks.

## 2022-11-24 ENCOUNTER — Telehealth: Payer: Self-pay | Admitting: Family Medicine

## 2022-11-24 NOTE — Telephone Encounter (Addendum)
Please check with patient about psychiatry options.  Two options would be Dr. Toy Care in [place] and Dr. Nicolasa Ducking in [place].  Please let me know if he has a preference and I can work on the referral.  Thanks.

## 2022-11-25 NOTE — Telephone Encounter (Signed)
Called and spoke to pt he has he would check  the options and call us back to let us know which one he will go with.

## 2022-11-25 NOTE — Telephone Encounter (Signed)
Noted. Thanks.  Will await input from patient.

## 2022-12-11 DIAGNOSIS — G4719 Other hypersomnia: Secondary | ICD-10-CM | POA: Diagnosis not present

## 2023-01-02 ENCOUNTER — Other Ambulatory Visit: Payer: Self-pay | Admitting: Family Medicine

## 2023-01-15 DIAGNOSIS — H40013 Open angle with borderline findings, low risk, bilateral: Secondary | ICD-10-CM | POA: Diagnosis not present

## 2023-01-15 DIAGNOSIS — H5022 Vertical strabismus, left eye: Secondary | ICD-10-CM | POA: Diagnosis not present

## 2023-01-15 DIAGNOSIS — H532 Diplopia: Secondary | ICD-10-CM | POA: Diagnosis not present

## 2023-01-15 DIAGNOSIS — H43811 Vitreous degeneration, right eye: Secondary | ICD-10-CM | POA: Diagnosis not present

## 2023-01-15 DIAGNOSIS — Z961 Presence of intraocular lens: Secondary | ICD-10-CM | POA: Diagnosis not present

## 2023-01-15 DIAGNOSIS — H26493 Other secondary cataract, bilateral: Secondary | ICD-10-CM | POA: Diagnosis not present

## 2023-01-15 DIAGNOSIS — H57812 Brow ptosis, left: Secondary | ICD-10-CM | POA: Diagnosis not present

## 2023-01-16 DIAGNOSIS — Z6830 Body mass index (BMI) 30.0-30.9, adult: Secondary | ICD-10-CM | POA: Diagnosis not present

## 2023-01-16 DIAGNOSIS — G4733 Obstructive sleep apnea (adult) (pediatric): Secondary | ICD-10-CM | POA: Diagnosis not present

## 2023-01-22 DIAGNOSIS — L814 Other melanin hyperpigmentation: Secondary | ICD-10-CM | POA: Diagnosis not present

## 2023-01-22 DIAGNOSIS — D1801 Hemangioma of skin and subcutaneous tissue: Secondary | ICD-10-CM | POA: Diagnosis not present

## 2023-01-22 DIAGNOSIS — Z08 Encounter for follow-up examination after completed treatment for malignant neoplasm: Secondary | ICD-10-CM | POA: Diagnosis not present

## 2023-01-22 DIAGNOSIS — L821 Other seborrheic keratosis: Secondary | ICD-10-CM | POA: Diagnosis not present

## 2023-01-22 DIAGNOSIS — Z85828 Personal history of other malignant neoplasm of skin: Secondary | ICD-10-CM | POA: Diagnosis not present

## 2023-01-26 ENCOUNTER — Telehealth: Payer: Self-pay | Admitting: Family Medicine

## 2023-01-26 NOTE — Telephone Encounter (Signed)
Please check with patient.  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  Is this being addressed by an outside clinic, i.e. cardiology?  Please let me know.  Thanks.

## 2023-01-27 NOTE — Telephone Encounter (Signed)
Called and spoke to patient, he states this is not being addressed by an outside clinic. He stated that the NP called and went over the study with him and options. He decided he would like to try and lose 20 pounds first and then repeat the study to determine if anything further is needed after the weight loss since his cause was mild.

## 2023-01-27 NOTE — Telephone Encounter (Signed)
Noted, thanks!

## 2023-03-01 ENCOUNTER — Other Ambulatory Visit: Payer: Self-pay | Admitting: Family Medicine

## 2023-03-27 ENCOUNTER — Other Ambulatory Visit: Payer: Self-pay | Admitting: Family Medicine

## 2023-04-02 DIAGNOSIS — R972 Elevated prostate specific antigen [PSA]: Secondary | ICD-10-CM | POA: Diagnosis not present

## 2023-04-08 DIAGNOSIS — R972 Elevated prostate specific antigen [PSA]: Secondary | ICD-10-CM | POA: Diagnosis not present

## 2023-04-08 DIAGNOSIS — N5201 Erectile dysfunction due to arterial insufficiency: Secondary | ICD-10-CM | POA: Diagnosis not present

## 2023-04-08 DIAGNOSIS — R351 Nocturia: Secondary | ICD-10-CM | POA: Diagnosis not present

## 2023-04-08 DIAGNOSIS — N401 Enlarged prostate with lower urinary tract symptoms: Secondary | ICD-10-CM | POA: Diagnosis not present

## 2023-05-22 ENCOUNTER — Ambulatory Visit (INDEPENDENT_AMBULATORY_CARE_PROVIDER_SITE_OTHER): Payer: Medicare HMO

## 2023-05-22 VITALS — Ht 69.0 in | Wt 196.0 lb

## 2023-05-22 DIAGNOSIS — Z0000 Encounter for general adult medical examination without abnormal findings: Secondary | ICD-10-CM | POA: Diagnosis not present

## 2023-05-22 NOTE — Patient Instructions (Addendum)
Keith [name] , Thank you for taking time to come for your Medicare Wellness Visit. I appreciate your ongoing commitment to your health goals. Please review the following plan we discussed and let me know if I can assist you in the future.   These are the goals we discussed:  Goals      My healthcare goal for 2024 is to lose some weight.        This is a list of the screening recommended for you and due dates:  Health Maintenance  Topic Date Due   Zoster (Shingles) Vaccine (2 of 2) 12/15/2019   COVID-19 Vaccine (7 - 2023-24 season) 12/08/2022   Flu Shot  06/12/2023   Medicare Annual Wellness Visit  05/21/2024   Colon Cancer Screening  01/30/2027   DTaP/Tdap/Td vaccine (6 - Td or Tdap) 11/19/2032   Pneumonia Vaccine  Completed   Hepatitis C Screening  Completed   HPV Vaccine  Aged Out    Advanced directives: Yes  Conditions/risks identified: Yes  Next appointment: Follow up in one year for your annual wellness visit.   Preventive Care 27 Years and Older, Male  Preventive care refers to lifestyle choices and visits with your health care provider that can promote health and wellness. What does preventive care include? A yearly physical exam. This is also called an annual well check. Dental exams once or twice a year. Routine eye exams. Ask your health care provider how often you should have your eyes checked. Personal lifestyle choices, including: Daily care of your teeth and gums. Regular physical activity. Eating a healthy diet. Avoiding tobacco and drug use. Limiting alcohol use. Practicing safe sex. Taking low doses of aspirin every day. Taking vitamin and mineral supplements as recommended by your health care provider. What happens during an annual well check? The services and screenings done by your health care provider during your annual well check will depend on your age, overall health, lifestyle risk factors, and family history of disease. Counseling  Your health  care provider may ask you questions about your: Alcohol use. Tobacco use. Drug use. Emotional well-being. Home and relationship well-being. Sexual activity. Eating habits. History of falls. Memory and ability to understand (cognition). Work and work Astronomer. Screening  You may have the following tests or measurements: Height, weight, and BMI. Blood pressure. Lipid and cholesterol levels. These may be checked every 5 years, or more frequently if you are over 61 years old. Skin check. Lung cancer screening. You may have this screening every year starting at age 44 if you have a 30-pack-year history of smoking and currently smoke or have quit within the past 15 years. Fecal occult blood test (FOBT) of the stool. You may have this test every year starting at age 31. Flexible sigmoidoscopy or colonoscopy. You may have a sigmoidoscopy every 5 years or a colonoscopy every 10 years starting at age 56. Prostate cancer screening. Recommendations will vary depending on your family history and other risks. Hepatitis C blood test. Hepatitis B blood test. Sexually transmitted disease (STD) testing. Diabetes screening. This is done by checking your blood sugar (glucose) after you have not eaten for a while (fasting). You may have this done every 1-3 years. Abdominal aortic aneurysm (AAA) screening. You may need this if you are a current or former smoker. Osteoporosis. You may be screened starting at age 76 if you are at high risk. Talk with your health care provider about your test results, treatment options, and if necessary, the need  for more tests. Vaccines  Your health care provider may recommend certain vaccines, such as: Influenza vaccine. This is recommended every year. Tetanus, diphtheria, and acellular pertussis (Tdap, Td) vaccine. You may need a Td booster every 10 years. Zoster vaccine. You may need this after age 82. Pneumococcal 13-valent conjugate (PCV13) vaccine. One dose is  recommended after age 65. Pneumococcal polysaccharide (PPSV23) vaccine. One dose is recommended after age 37. Talk to your health care provider about which screenings and vaccines you need and how often you need them. This information is not intended to replace advice given to you by your health care provider. Make sure you discuss any questions you have with your health care provider. Document Released: 11/24/2015 Document Revised: 07/17/2016 Document Reviewed: 08/29/2015 Elsevier Interactive Patient Education  2017 [employer].  Fall Prevention in the Home Falls can cause injuries. They can happen to people of all ages. There are many things you can do to make your home safe and to help prevent falls. What can I do on the outside of my home? Regularly fix the edges of walkways and driveways and fix any cracks. Remove anything that might make you trip as you walk through a door, such as a raised step or threshold. Trim any bushes or trees on the path to your home. Use bright outdoor lighting. Clear any walking paths of anything that might make someone trip, such as rocks or tools. Regularly check to see if handrails are loose or broken. Make sure that both sides of any steps have handrails. Any raised decks and porches should have guardrails on the edges. Have any leaves, snow, or ice cleared regularly. Use sand or salt on walking paths during winter. Clean up any spills in your garage right away. This includes oil or grease spills. What can I do in the bathroom? Use night lights. Install grab bars by the toilet and in the tub and shower. Do not use towel bars as grab bars. Use non-skid mats or decals in the tub or shower. If you need to sit down in the shower, use a plastic, non-slip stool. Keep the floor dry. Clean up any water that spills on the floor as soon as it happens. Remove soap buildup in the tub or shower regularly. Attach bath mats securely with double-sided non-slip rug  tape. Do not have throw rugs and other things on the floor that can make you trip. What can I do in the bedroom? Use night lights. Make sure that you have a light by your bed that is easy to reach. Do not use any sheets or blankets that are too big for your bed. They should not hang down onto the floor. Have a firm chair that has side arms. You can use this for support while you get dressed. Do not have throw rugs and other things on the floor that can make you trip. What can I do in the kitchen? Clean up any spills right away. Avoid walking on wet floors. Keep items that you use a lot in easy-to-reach places. If you need to reach something above you, use a strong step stool that has a grab bar. Keep electrical cords out of the way. Do not use floor polish or wax that makes floors slippery. If you must use wax, use non-skid floor wax. Do not have throw rugs and other things on the floor that can make you trip. What can I do with my stairs? Do not leave any items on the stairs.  Make sure that there are handrails on both sides of the stairs and use them. Fix handrails that are broken or loose. Make sure that handrails are as long as the stairways. Check any carpeting to make sure that it is firmly attached to the stairs. Fix any carpet that is loose or worn. Avoid having throw rugs at the top or bottom of the stairs. If you do have throw rugs, attach them to the floor with carpet tape. Make sure that you have a light switch at the top of the stairs and the bottom of the stairs. If you do not have them, ask someone to add them for you. What else can I do to help prevent falls? Wear shoes that: Do not have high heels. Have rubber bottoms. Are comfortable and fit you well. Are closed at the toe. Do not wear sandals. If you use a stepladder: Make sure that it is fully opened. Do not climb a closed stepladder. Make sure that both sides of the stepladder are locked into place. Ask someone to  hold it for you, if possible. Clearly mark and make sure that you can see: Any grab bars or handrails. First and last steps. Where the edge of each step is. Use tools that help you move around (mobility aids) if they are needed. These include: Canes. Walkers. Scooters. Crutches. Turn on the lights when you go into a dark area. Replace any light bulbs as soon as they burn out. Set up your furniture so you have a clear path. Avoid moving your furniture around. If any of your floors are uneven, fix them. If there are any pets around you, be aware of where they are. Review your medicines with your doctor. Some medicines can make you feel dizzy. This can increase your chance of falling. Ask your doctor what other things that you can do to help prevent falls. This information is not intended to replace advice given to you by your health care provider. Make sure you discuss any questions you have with your health care provider. Document Released: 08/24/2009 Document Revised: 04/04/2016 Document Reviewed: 12/02/2014 Elsevier Interactive Patient Education  2017 [employer].
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
## 2023-06-09 DIAGNOSIS — E349 Endocrine disorder, unspecified: Secondary | ICD-10-CM | POA: Diagnosis not present

## 2023-06-23 DIAGNOSIS — H43811 Vitreous degeneration, right eye: Secondary | ICD-10-CM | POA: Diagnosis not present

## 2023-06-23 DIAGNOSIS — H01024 Squamous blepharitis left upper eyelid: Secondary | ICD-10-CM | POA: Diagnosis not present

## 2023-06-23 DIAGNOSIS — Z961 Presence of intraocular lens: Secondary | ICD-10-CM | POA: Diagnosis not present

## 2023-06-23 DIAGNOSIS — H40013 Open angle with borderline findings, low risk, bilateral: Secondary | ICD-10-CM | POA: Diagnosis not present

## 2023-06-23 DIAGNOSIS — H26491 Other secondary cataract, right eye: Secondary | ICD-10-CM | POA: Diagnosis not present

## 2023-06-23 DIAGNOSIS — H532 Diplopia: Secondary | ICD-10-CM | POA: Diagnosis not present

## 2023-06-23 DIAGNOSIS — H01021 Squamous blepharitis right upper eyelid: Secondary | ICD-10-CM | POA: Diagnosis not present

## 2023-07-11 ENCOUNTER — Ambulatory Visit: Payer: Medicare HMO | Admitting: Cardiology

## 2023-07-21 ENCOUNTER — Ambulatory Visit: Payer: Medicare HMO | Admitting: Cardiology

## 2023-07-23 DIAGNOSIS — H5022 Vertical strabismus, left eye: Secondary | ICD-10-CM | POA: Diagnosis not present

## 2023-07-23 DIAGNOSIS — H43811 Vitreous degeneration, right eye: Secondary | ICD-10-CM | POA: Diagnosis not present

## 2023-07-23 DIAGNOSIS — H40013 Open angle with borderline findings, low risk, bilateral: Secondary | ICD-10-CM | POA: Diagnosis not present

## 2023-07-23 DIAGNOSIS — H57812 Brow ptosis, left: Secondary | ICD-10-CM | POA: Diagnosis not present

## 2023-07-23 DIAGNOSIS — H26492 Other secondary cataract, left eye: Secondary | ICD-10-CM | POA: Diagnosis not present

## 2023-07-23 DIAGNOSIS — Z961 Presence of intraocular lens: Secondary | ICD-10-CM | POA: Diagnosis not present

## 2023-07-23 DIAGNOSIS — H01021 Squamous blepharitis right upper eyelid: Secondary | ICD-10-CM | POA: Diagnosis not present

## 2023-07-23 DIAGNOSIS — H532 Diplopia: Secondary | ICD-10-CM | POA: Diagnosis not present

## 2023-07-23 DIAGNOSIS — H01024 Squamous blepharitis left upper eyelid: Secondary | ICD-10-CM | POA: Diagnosis not present

## 2023-08-07 DIAGNOSIS — L814 Other melanin hyperpigmentation: Secondary | ICD-10-CM | POA: Diagnosis not present

## 2023-08-07 DIAGNOSIS — L821 Other seborrheic keratosis: Secondary | ICD-10-CM | POA: Diagnosis not present

## 2023-08-07 DIAGNOSIS — L570 Actinic keratosis: Secondary | ICD-10-CM | POA: Diagnosis not present

## 2023-08-07 DIAGNOSIS — Z08 Encounter for follow-up examination after completed treatment for malignant neoplasm: Secondary | ICD-10-CM | POA: Diagnosis not present

## 2023-08-07 DIAGNOSIS — Z85828 Personal history of other malignant neoplasm of skin: Secondary | ICD-10-CM | POA: Diagnosis not present

## 2023-08-07 DIAGNOSIS — D225 Melanocytic nevi of trunk: Secondary | ICD-10-CM | POA: Diagnosis not present

## 2023-08-17 ENCOUNTER — Other Ambulatory Visit: Payer: Self-pay | Admitting: Family Medicine

## 2023-08-27 ENCOUNTER — Ambulatory Visit: Payer: Medicare HMO | Attending: Cardiology | Admitting: Cardiology

## 2023-08-27 ENCOUNTER — Other Ambulatory Visit: Payer: Self-pay | Admitting: Cardiology

## 2023-08-27 ENCOUNTER — Encounter: Payer: Self-pay | Admitting: Cardiology

## 2023-08-27 VITALS — BP 124/66 | HR 53 | Resp 16 | Ht 69.0 in | Wt 196.8 lb

## 2023-08-27 DIAGNOSIS — E7800 Pure hypercholesterolemia, unspecified: Secondary | ICD-10-CM

## 2023-08-27 DIAGNOSIS — I10 Essential (primary) hypertension: Secondary | ICD-10-CM

## 2023-08-27 DIAGNOSIS — I4729 Other ventricular tachycardia: Secondary | ICD-10-CM

## 2023-08-27 DIAGNOSIS — R002 Palpitations: Secondary | ICD-10-CM

## 2023-08-27 NOTE — Patient Instructions (Signed)
Medication Instructions:  Your physician recommends that you continue on your current medications as directed. Please refer to the Current Medication list given to you today.  *If you need a refill on your cardiac medications before your next appointment, please call your pharmacy*  Lab Work: TODAY: Lipids, CBC, CMP If you have labs (blood work) drawn today and your tests are completely normal, you will receive your results only by: MyChart Message (if you have MyChart) OR A paper copy in the mail If you have any lab test that is abnormal or we need to change your treatment, we will call you to review the results.  Testing/Procedures: None ordered today.  Follow-Up: At [facility], you and your health needs are our priority.  As part of our continuing mission to provide you with exceptional heart care, we have created designated Provider Care Teams.  These Care Teams include your primary Cardiologist (physician) and Advanced Practice Providers (APPs -  Physician Assistants and Nurse Practitioners) who all work together to provide you with the care you need, when you need it.  Your next appointment:   1 year(s)  The format for your next appointment:   In Person  Provider:   Yates Decamp, MD
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
## 2023-08-28 LAB — COMPREHENSIVE METABOLIC PANEL
ALT: 19 [IU]/L (ref 0–44)
AST: 30 [IU]/L (ref 0–40)
Albumin: 4.2 g/dL (ref 3.8–4.8)
Alkaline Phosphatase: 62 [IU]/L (ref 44–121)
BUN/Creatinine Ratio: 13 (ref 10–24)
BUN: 17 mg/dL (ref 8–27)
Bilirubin Total: 0.8 mg/dL (ref 0.0–1.2)
CO2: 25 mmol/L (ref 20–29)
Calcium: 9.7 mg/dL (ref 8.6–10.2)
Chloride: 102 mmol/L (ref 96–106)
Creatinine, Ser: 1.32 mg/dL — ABNORMAL HIGH (ref 0.76–1.27)
Globulin, Total: 2.8 g/dL (ref 1.5–4.5)
Glucose: 87 mg/dL (ref 70–99)
Potassium: 4.9 mmol/L (ref 3.5–5.2)
Sodium: 139 mmol/L (ref 134–144)
Total Protein: 7 g/dL (ref 6.0–8.5)
eGFR: 56 mL/min/{1.73_m2} — ABNORMAL LOW (ref >59)

## 2023-08-28 LAB — LIPID PANEL
Chol/HDL Ratio: 2.7 {ratio} (ref 0.0–5.0)
Cholesterol, Total: 130 mg/dL (ref 100–199)
HDL: 48 mg/dL (ref >39)
LDL Chol Calc (NIH): 70 mg/dL (ref 0–99)
Triglycerides: 57 mg/dL (ref 0–149)
VLDL Cholesterol Cal: 12 mg/dL (ref 5–40)

## 2023-08-28 LAB — CBC
Hematocrit: 47.3 % (ref 37.5–51.0)
Hemoglobin: 16 g/dL (ref 13.0–17.7)
MCH: 31.1 pg (ref 26.6–33.0)
MCHC: 33.8 g/dL (ref 31.5–35.7)
MCV: 92 fL (ref 79–97)
Platelets: 281 10*3/uL (ref 150–450)
RBC: 5.14 x10E6/uL (ref 4.14–5.80)
RDW: 12.4 % (ref 11.6–15.4)
WBC: 7.1 10*3/uL (ref 3.4–10.8)

## 2023-11-29 ENCOUNTER — Other Ambulatory Visit: Payer: Self-pay | Admitting: Family Medicine

## 2023-12-04 ENCOUNTER — Ambulatory Visit: Payer: Medicare HMO | Admitting: Family Medicine

## 2023-12-05 ENCOUNTER — Encounter: Payer: Self-pay | Admitting: Family Medicine

## 2023-12-17 ENCOUNTER — Telehealth: Payer: Self-pay | Admitting: Family Medicine

## 2023-12-17 NOTE — Telephone Encounter (Signed)
 I called patient about appointment tomorrow.  I am unexpectedly going to be out of clinic.  Patient was very gracious about this.  I told him we would get him rescheduled.  I thanked him for taking the call.

## 2023-12-18 ENCOUNTER — Ambulatory Visit: Payer: Medicare HMO | Admitting: Family Medicine

## 2023-12-18 NOTE — Telephone Encounter (Signed)
Please call patient and reschedule appointment. 

## 2024-01-21 DIAGNOSIS — H40013 Open angle with borderline findings, low risk, bilateral: Secondary | ICD-10-CM | POA: Diagnosis not present

## 2024-01-21 DIAGNOSIS — H5022 Vertical strabismus, left eye: Secondary | ICD-10-CM | POA: Diagnosis not present

## 2024-01-21 DIAGNOSIS — H532 Diplopia: Secondary | ICD-10-CM | POA: Diagnosis not present

## 2024-01-21 DIAGNOSIS — H57812 Brow ptosis, left: Secondary | ICD-10-CM | POA: Diagnosis not present

## 2024-02-05 ENCOUNTER — Ambulatory Visit: Admitting: Family Medicine

## 2024-02-25 ENCOUNTER — Other Ambulatory Visit: Payer: Self-pay | Admitting: Family Medicine

## 2024-03-11 ENCOUNTER — Ambulatory Visit: Admitting: Family Medicine

## 2024-03-11 ENCOUNTER — Encounter: Payer: Self-pay | Admitting: Family Medicine

## 2024-03-11 VITALS — BP 118/62 | HR 52 | Temp 98.4°F | Ht 69.0 in | Wt 197.8 lb

## 2024-03-11 DIAGNOSIS — R221 Localized swelling, mass and lump, neck: Secondary | ICD-10-CM

## 2024-03-11 LAB — CBC WITH DIFFERENTIAL/PLATELET
Absolute Lymphocytes: 1991 {cells}/uL (ref 850–3900)
Absolute Monocytes: 577 {cells}/uL (ref 200–950)
Basophils Absolute: 89 {cells}/uL (ref 0–200)
Basophils Relative: 1.2 %
Eosinophils Absolute: 111 {cells}/uL (ref 15–500)
Eosinophils Relative: 1.5 %
HCT: 44.6 % (ref 38.5–50.0)
Hemoglobin: 15.2 g/dL (ref 13.2–17.1)
MCH: 30.5 pg (ref 27.0–33.0)
MCHC: 34.1 g/dL (ref 32.0–36.0)
MCV: 89.6 fL (ref 80.0–100.0)
MPV: 10.5 fL (ref 7.5–12.5)
Monocytes Relative: 7.8 %
Neutro Abs: 4632 {cells}/uL (ref 1500–7800)
Neutrophils Relative %: 62.6 %
Platelets: 282 10*3/uL (ref 140–400)
RBC: 4.98 10*6/uL (ref 4.20–5.80)
RDW: 12.6 % (ref 11.0–15.0)
Total Lymphocyte: 26.9 %
WBC: 7.4 10*3/uL (ref 3.8–10.8)

## 2024-03-11 LAB — COMPREHENSIVE METABOLIC PANEL WITH GFR
ALT: 16 U/L (ref 0–53)
AST: 24 U/L (ref 0–37)
Albumin: 4.1 g/dL (ref 3.5–5.2)
Alkaline Phosphatase: 52 U/L (ref 39–117)
BUN: 12 mg/dL (ref 6–23)
CO2: 26 meq/L (ref 19–32)
Calcium: 9.2 mg/dL (ref 8.4–10.5)
Chloride: 104 meq/L (ref 96–112)
Creatinine, Ser: 1.2 mg/dL (ref 0.40–1.50)
GFR: 58.05 mL/min — ABNORMAL LOW (ref >60.00)
Glucose, Bld: 91 mg/dL (ref 70–99)
Potassium: 3.7 meq/L (ref 3.5–5.1)
Sodium: 136 meq/L (ref 135–145)
Total Bilirubin: 1 mg/dL (ref 0.2–1.2)
Total Protein: 7.1 g/dL (ref 6.0–8.3)

## 2024-03-11 MED ORDER — TADALAFIL 20 MG PO TABS: ORAL_TABLET | ORAL | Status: DC

## 2024-03-11 NOTE — Progress Notes (Unsigned)
 Lump noted on the L side of the neck, not tender initially but is sore to touch now.  Can have pain radiate up to the L side of the face/ear.  Had neg dental eval, ie w/o issue seen there- dental eval predates the onset of the lesion.  No trauma.  No ear pain o/w.  No ST.    He is on metoprolol  and working out 3 times per week.    His relationship with sig other ended, d/w pt.  Mood was lower with that, d/w pt.  He is still in counseling.    Meds, vitals, and allergies reviewed.   ROS: Per HPI unless specifically indicated in ROS section   Nad Ncat OP wnl L neck with single tender nodule.   No other LA in the neck or axilla B.  No clavicular adenopathy. Rrr Ctab Benign abd exam.  Nontender.  Normal bowel sounds.  No splenic or hepatic enlargement noted. Skin well-perfused.  35 minutes were devoted to patient care in this encounter (this includes time spent reviewing the patient's file/history, interviewing and examining the patient, counseling/reviewing plan with patient).

## 2024-03-11 NOTE — Patient Instructions (Signed)
 Go to the lab on the way out.   If you have mychart we'll likely use that to update you.    Take care.  Glad to see you. Let me know if you can't get the ultrasound done or if you can't get set up with ENT.  If you have changes then let me know.

## 2024-03-14 ENCOUNTER — Encounter: Payer: Self-pay | Admitting: Family Medicine

## 2024-03-14 DIAGNOSIS — R221 Localized swelling, mass and lump, neck: Secondary | ICD-10-CM | POA: Insufficient documentation

## 2024-03-14 NOTE — Assessment & Plan Note (Signed)
 See notes on labs.  Check ultrasound and refer to ENT.  Rationale for plan discussed with patient he agreed.

## 2024-03-17 ENCOUNTER — Ambulatory Visit
Admission: RE | Admit: 2024-03-17 | Discharge: 2024-03-17 | Disposition: A | Discharge status: Home / Self Care | Source: Ambulatory Visit | Attending: Family Medicine | Admitting: Family Medicine

## 2024-03-17 DIAGNOSIS — R221 Localized swelling, mass and lump, neck: Secondary | ICD-10-CM | POA: Diagnosis not present

## 2024-03-18 ENCOUNTER — Encounter: Payer: Self-pay | Admitting: Family Medicine

## 2024-03-21 ENCOUNTER — Other Ambulatory Visit: Payer: Self-pay | Admitting: Family Medicine

## 2024-03-25 DIAGNOSIS — R0989 Other specified symptoms and signs involving the circulatory and respiratory systems: Secondary | ICD-10-CM | POA: Diagnosis not present

## 2024-03-29 ENCOUNTER — Ambulatory Visit: Payer: Self-pay | Admitting: Family Medicine

## 2024-04-09 DIAGNOSIS — R351 Nocturia: Secondary | ICD-10-CM | POA: Diagnosis not present

## 2024-04-09 DIAGNOSIS — R972 Elevated prostate specific antigen [PSA]: Secondary | ICD-10-CM | POA: Diagnosis not present

## 2024-04-09 DIAGNOSIS — N5201 Erectile dysfunction due to arterial insufficiency: Secondary | ICD-10-CM | POA: Diagnosis not present

## 2024-04-09 DIAGNOSIS — N401 Enlarged prostate with lower urinary tract symptoms: Secondary | ICD-10-CM | POA: Diagnosis not present

## 2024-05-22 ENCOUNTER — Other Ambulatory Visit: Payer: Self-pay | Admitting: Family Medicine

## 2024-05-24 ENCOUNTER — Ambulatory Visit: Payer: Medicare HMO

## 2024-05-25 ENCOUNTER — Ambulatory Visit

## 2024-06-08 DIAGNOSIS — D2372 Other benign neoplasm of skin of left lower limb, including hip: Secondary | ICD-10-CM | POA: Diagnosis not present

## 2024-06-08 DIAGNOSIS — L570 Actinic keratosis: Secondary | ICD-10-CM | POA: Diagnosis not present

## 2024-06-08 DIAGNOSIS — L218 Other seborrheic dermatitis: Secondary | ICD-10-CM | POA: Diagnosis not present

## 2024-06-08 DIAGNOSIS — L821 Other seborrheic keratosis: Secondary | ICD-10-CM | POA: Diagnosis not present

## 2024-06-08 DIAGNOSIS — Z85828 Personal history of other malignant neoplasm of skin: Secondary | ICD-10-CM | POA: Diagnosis not present

## 2024-06-08 DIAGNOSIS — Z08 Encounter for follow-up examination after completed treatment for malignant neoplasm: Secondary | ICD-10-CM | POA: Diagnosis not present

## 2024-06-08 DIAGNOSIS — L814 Other melanin hyperpigmentation: Secondary | ICD-10-CM | POA: Diagnosis not present

## 2024-07-01 ENCOUNTER — Ambulatory Visit

## 2024-07-01 VITALS — Ht 69.0 in | Wt 197.0 lb

## 2024-07-01 DIAGNOSIS — Z0000 Encounter for general adult medical examination without abnormal findings: Secondary | ICD-10-CM | POA: Diagnosis not present
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
## 2024-07-01 NOTE — Patient Instructions (Signed)
 Keith [name] , Thank you for taking time out of your busy schedule to complete your Annual Wellness Visit with me. I enjoyed our conversation and look forward to speaking with you again next year. I, as well as your care team,  appreciate your ongoing commitment to your health goals. Please review the following plan we discussed and let me know if I can assist you in the future. Your Game plan/ To Do List    Referrals: If you haven't heard from the office you've been referred to, please reach out to them at the phone provided.   Follow up Visits: We will see or speak with you next year for your Next Medicare AWV with our clinical staff-07/06/25 @ 10:10am teleivist Have you seen your provider in the last 6 months (3 months if uncontrolled diabetes)? Yes  Clinician Recommendations:  Aim for 30 minutes of exercise or brisk walking, 6-8 glasses of water, and 5 servings of fruits and vegetables each day.        This is a list of the screenings recommended for you:  Health Maintenance  Topic Date Due   Zoster (Shingles) Vaccine (2 of 2) 12/15/2019   COVID-19 Vaccine (10 - 2024-25 season) 02/22/2024   Flu Shot  06/11/2024   Colon Cancer Screening  01/29/2025   Medicare Annual Wellness Visit  07/01/2025   DTaP/Tdap/Td vaccine (8 - Td or Tdap) 11/19/2032   Pneumococcal Vaccine for age over 16  Completed   Hepatitis C Screening  Completed   HPV Vaccine  Aged Out   Meningitis B Vaccine  Aged Out   Hepatitis B Vaccine  Discontinued    Advanced directives: (In Chart) A copy of your advanced directives are scanned into your chart should your provider ever need it. Advance Care Planning is important because it:  [x]  Makes sure you receive the medical care that is consistent with your values, goals, and preferences  [x]  It provides guidance to your family and loved ones and reduces their decisional burden about whether or not they are making the right decisions based on your wishes.  Follow the  link provided in your after visit summary or read over the paperwork we have mailed to you to help you started getting your Advance Directives in place. If you need assistance in completing these, please reach out to us  so that we can help you!

## 2024-07-07 DIAGNOSIS — H6121 Impacted cerumen, right ear: Secondary | ICD-10-CM | POA: Diagnosis not present

## 2024-07-07 DIAGNOSIS — M542 Cervicalgia: Secondary | ICD-10-CM | POA: Diagnosis not present

## 2024-07-26 ENCOUNTER — Telehealth: Payer: Self-pay | Admitting: Family Medicine

## 2024-07-26 DIAGNOSIS — I10 Essential (primary) hypertension: Secondary | ICD-10-CM

## 2024-07-26 NOTE — Telephone Encounter (Signed)
 Dr Frederik would like lab orders places so he may do the labs at his office prior to CPE scheduled for 08/26/24

## 2024-07-27 NOTE — Addendum Note (Signed)
 Addended by: CLEATUS ARLYSS RAMAN on: 07/27/2024 06:44 AM   Modules accepted: Orders

## 2024-07-27 NOTE — Telephone Encounter (Signed)
 Please let him know that I put in the orders.  Thanks.

## 2024-07-29 ENCOUNTER — Ambulatory Visit: Admitting: Family Medicine

## 2024-07-29 ENCOUNTER — Encounter: Payer: Self-pay | Admitting: Family Medicine

## 2024-07-29 VITALS — BP 138/66 | HR 58 | Temp 98.6°F | Ht 69.0 in | Wt 194.2 lb

## 2024-07-29 DIAGNOSIS — R221 Localized swelling, mass and lump, neck: Secondary | ICD-10-CM

## 2024-07-29 NOTE — Progress Notes (Signed)
 L sided neck pain.  Going on for months.  Prev LN eval with ENT referral, prev u/s done.  More painful now.  Not severe pain but ttp locally.  No fevers.  No R sided sx.  No weight loss, no night sweats.  Had repeat dental eval, neg eval there.   D/w pt about IV dye cautions.  Discussed that I want to check with radiology about options.  He has 75-month-old granddaughter Archivist) is doing well.  Meds, vitals, and allergies reviewed.   ROS: Per HPI unless specifically indicated in ROS section   Nad Ncat Neck supple, no LA but left-sided submandibular lesion noted without overlying skin changes. Rrr Ctab Skin well-perfused.

## 2024-07-29 NOTE — Addendum Note (Signed)
 Addended by: CLEATUS ARLYSS RAMAN on: 07/29/2024 04:27 PM   Modules accepted: Orders

## 2024-07-29 NOTE — Patient Instructions (Signed)
 Let me check with radiology about CT vs u/s.  We'll be in touch.  Take care.  Glad to see you.

## 2024-08-01 ENCOUNTER — Telehealth: Payer: Self-pay | Admitting: Family Medicine

## 2024-08-01 DIAGNOSIS — R221 Localized swelling, mass and lump, neck: Secondary | ICD-10-CM

## 2024-08-01 NOTE — Assessment & Plan Note (Signed)
 Discussed options.  I checked with radiology and one way to avoid his contrast allergy history would be to get an MRI done.  That may provide more information then ultrasound.  See following phone note.

## 2024-08-01 NOTE — Telephone Encounter (Signed)
 Please check with patient.  I checked with radiology and an option would be to get MRI soft tissue with contrast.  This should not be an issue with his previous contrast allergy.  I think this would be a reasonable option and I went ahead and put in the order.  Please let me know his thoughts.  Thanks.

## 2024-08-02 ENCOUNTER — Encounter: Payer: Self-pay | Admitting: Family Medicine

## 2024-08-02 NOTE — Telephone Encounter (Signed)
 Left voicemail for patient to return call to office.

## 2024-08-03 NOTE — Telephone Encounter (Signed)
 Left voicemail for patient to return call to office.

## 2024-08-04 NOTE — Telephone Encounter (Signed)
 Copied from CRM [id number]. Topic: General - Other >> Aug 03, 2024 10:03 AM Pinkey ORN wrote: Reason for CRM: Returning Missed Office Call >> Aug 04, 2024  2:41 PM Henretta I wrote: Patient called back to speak with Mrs. Trudy but she was with patient and he dropped from the line  >> Aug 03, 2024 10:08 AM Pinkey ORN wrote: Patient is returning missed office call from Trudy Davene CROME, CMA

## 2024-08-05 NOTE — Telephone Encounter (Signed)
 Patient notified and has been scheduled for next Wednesday

## 2024-08-11 ENCOUNTER — Inpatient Hospital Stay: Admission: RE | Admit: 2024-08-11 | Source: Ambulatory Visit

## 2024-08-11 DIAGNOSIS — H01021 Squamous blepharitis right upper eyelid: Secondary | ICD-10-CM | POA: Diagnosis not present

## 2024-08-11 DIAGNOSIS — H01024 Squamous blepharitis left upper eyelid: Secondary | ICD-10-CM | POA: Diagnosis not present

## 2024-08-11 DIAGNOSIS — H532 Diplopia: Secondary | ICD-10-CM | POA: Diagnosis not present

## 2024-08-11 DIAGNOSIS — H40013 Open angle with borderline findings, low risk, bilateral: Secondary | ICD-10-CM | POA: Diagnosis not present

## 2024-08-11 DIAGNOSIS — H5022 Vertical strabismus, left eye: Secondary | ICD-10-CM | POA: Diagnosis not present

## 2024-08-11 DIAGNOSIS — H43811 Vitreous degeneration, right eye: Secondary | ICD-10-CM | POA: Diagnosis not present

## 2024-08-11 DIAGNOSIS — Z961 Presence of intraocular lens: Secondary | ICD-10-CM | POA: Diagnosis not present

## 2024-08-11 DIAGNOSIS — H57812 Brow ptosis, left: Secondary | ICD-10-CM | POA: Diagnosis not present

## 2024-08-19 DIAGNOSIS — I10 Essential (primary) hypertension: Secondary | ICD-10-CM | POA: Diagnosis not present

## 2024-08-22 ENCOUNTER — Ambulatory Visit: Payer: Self-pay | Admitting: Family Medicine

## 2024-08-22 ENCOUNTER — Other Ambulatory Visit: Payer: Self-pay | Admitting: Family Medicine

## 2024-08-22 DIAGNOSIS — E875 Hyperkalemia: Secondary | ICD-10-CM

## 2024-08-23 ENCOUNTER — Encounter: Payer: Self-pay | Admitting: Family Medicine

## 2024-08-23 DIAGNOSIS — Z0100 Encounter for examination of eyes and vision without abnormal findings: Secondary | ICD-10-CM | POA: Diagnosis not present

## 2024-08-24 ENCOUNTER — Encounter: Payer: Self-pay | Admitting: Cardiology

## 2024-08-24 ENCOUNTER — Ambulatory Visit: Attending: Cardiology | Admitting: Cardiology

## 2024-08-24 VITALS — BP 132/60 | HR 55 | Resp 16 | Ht 69.0 in | Wt 186.0 lb

## 2024-08-24 DIAGNOSIS — I10 Essential (primary) hypertension: Secondary | ICD-10-CM

## 2024-08-24 DIAGNOSIS — R002 Palpitations: Secondary | ICD-10-CM

## 2024-08-24 DIAGNOSIS — E7800 Pure hypercholesterolemia, unspecified: Secondary | ICD-10-CM | POA: Diagnosis not present

## 2024-08-24 DIAGNOSIS — I4729 Other ventricular tachycardia: Secondary | ICD-10-CM

## 2024-08-24 LAB — CBC WITH DIFFERENTIAL/PLATELET
Basophils Absolute: 0.1 x10E3/uL (ref 0.0–0.2)
Basos: 1 %
EOS (ABSOLUTE): 0.2 x10E3/uL (ref 0.0–0.4)
Eos: 2 %
Hematocrit: 45.2 % (ref 37.5–51.0)
Hemoglobin: 15.5 g/dL (ref 13.0–17.7)
Immature Grans (Abs): 0 x10E3/uL (ref 0.0–0.1)
Immature Granulocytes: 0 %
Lymphocytes Absolute: 1.4 x10E3/uL (ref 0.7–3.1)
Lymphs: 18 %
MCH: 30.9 pg (ref 26.6–33.0)
MCHC: 34.3 g/dL (ref 31.5–35.7)
MCV: 90 fL (ref 79–97)
Monocytes Absolute: 0.5 x10E3/uL (ref 0.1–0.9)
Monocytes: 7 %
Neutrophils Absolute: 5.5 x10E3/uL (ref 1.4–7.0)
Neutrophils: 72 %
Platelets: 270 x10E3/uL (ref 150–450)
RBC: 5.01 x10E6/uL (ref 4.14–5.80)
RDW: 12.2 % (ref 11.6–15.4)
WBC: 7.6 x10E3/uL (ref 3.4–10.8)

## 2024-08-24 LAB — COMPREHENSIVE METABOLIC PANEL WITH GFR
ALT: 14 IU/L (ref 0–44)
AST: 27 IU/L (ref 0–40)
Albumin: 4.3 g/dL (ref 3.8–4.8)
Alkaline Phosphatase: 68 IU/L (ref 47–123)
BUN/Creatinine Ratio: 11 (ref 10–24)
BUN: 14 mg/dL (ref 8–27)
Bilirubin Total: 0.8 mg/dL (ref 0.0–1.2)
CO2: 22 mmol/L (ref 20–29)
Calcium: 9.4 mg/dL (ref 8.6–10.2)
Chloride: 102 mmol/L (ref 96–106)
Creatinine, Ser: 1.32 mg/dL — ABNORMAL HIGH (ref 0.76–1.27)
Globulin, Total: 2.7 g/dL (ref 1.5–4.5)
Glucose: 89 mg/dL (ref 70–99)
Potassium: 5.6 mmol/L — ABNORMAL HIGH (ref 3.5–5.2)
Sodium: 140 mmol/L (ref 134–144)
Total Protein: 7 g/dL (ref 6.0–8.5)
eGFR: 55 mL/min/1.73 — ABNORMAL LOW (ref >59)

## 2024-08-24 LAB — LIPID PANEL
Chol/HDL Ratio: 2.8 ratio (ref 0.0–5.0)
Cholesterol, Total: 126 mg/dL (ref 100–199)
HDL: 45 mg/dL (ref >39)
LDL Chol Calc (NIH): 69 mg/dL (ref 0–99)
Triglycerides: 51 mg/dL (ref 0–149)
VLDL Cholesterol Cal: 12 mg/dL (ref 5–40)

## 2024-08-24 LAB — TSH: TSH: 1.31 u[IU]/mL (ref 0.450–4.500)

## 2024-08-24 NOTE — Progress Notes (Signed)
 Cardiology Office Note:  .   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  He had brief paroxysmal AF on 11/18/2019 with resolution of recurrence since being on low-dose beta-blocker therapy although he continues to be bradycardic but asymptomatic and anticoagulation was discontinued after mutual discussion.   He remains asymptomatic and continues to exercise regularly and has had occasional episodes of palpitations if he misses the dose of the beta-blocker.  Cardiac Studies relevent.    Coronary Angiogram  12/24/2016 &11/18/2019: Normal coronary arteries. Normal LVEF.    Echocardiogram 05/18/2020: Normal LV systolic function with visual EF 55-60%. Left ventricle cavity is normal in size. Moderate left ventricular hypertrophy. Normal global wall motion. Normal diastolic filling pattern. Calculated EF 55%. Mild (Grade I) mitral regurgitation. Mild pulmonic regurgitation.    Discussed the use of AI scribe software for clinical note transcription with the patient, who gave verbal consent to proceed.  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  He experiences no chest pain or rhythm disturbances. He maintains an active lifestyle, exercising four days a week with a trainer and running eight to nine miles on weekends. He had palpitations after missing a dose of metoprolol , which resolved  upon resuming the medication. He is on a small dose of metoprolol  and a statin.  Labs   Lab Results  Component Value Date   CHOL 126 08/19/2024   HDL 45 08/19/2024   LDLCALC 69 08/19/2024   TRIG 51 08/19/2024   CHOLHDL 2.8 08/19/2024   No results found for: LIPOA  Recent Labs    08/27/23 1041 03/11/24 1119 08/19/24 0907  NA 139 136 140  K 4.9 3.7 5.6*  CL 102 104 102  CO2 25 26 22   GLUCOSE 87 91 89  BUN 17 12 14   CREATININE 1.32* 1.20 1.32*  CALCIUM  9.7 9.2 9.4    Lab Results  Component Value Date   ALT 14 08/19/2024   AST 27 08/19/2024   ALKPHOS 68 08/19/2024   BILITOT 0.8 08/19/2024      Latest Ref Rng & Units 08/19/2024    9:07 AM 03/11/2024   11:19 AM 08/27/2023   10:41 AM  CBC  WBC 3.4 - 10.8 x10E3/uL 7.6  7.4  7.1   Hemoglobin 13.0 - 17.7 g/dL 84.4  84.7  83.9   Hematocrit 37.5 - 51.0 % 45.2  44.6  47.3   Platelets 150 - 450 x10E3/uL 270  282  281    No results found for: HGBA1C  Lab Results  Component Value Date   TSH 1.310 08/19/2024    ROS  Review of Systems  Cardiovascular:  Positive for palpitations. Negative for chest pain, dyspnea on exertion and leg swelling.   Physical Exam:   VS:  BP 132/60 (BP Location: Left Arm, Patient Position: Sitting, Cuff Size: Normal)   Pulse (!) 55   Resp 16   Ht 5'  9 (1.753 m)   Wt 186 lb (84.4 kg)   SpO2 97%   BMI 27.47 kg/m    Wt Readings from Last 3 Encounters:  08/24/24 186 lb (84.4 kg)  07/29/24 194 lb 3.2 oz (88.1 kg)  07/01/24 197 lb (89.4 kg)    BP Readings from Last 3 Encounters:  08/24/24 132/60  07/29/24 138/66  03/11/24 118/62   Physical Exam Neck:     Vascular: No carotid bruit or JVD.  Cardiovascular:     Rate and Rhythm: Normal rate and regular rhythm.     Pulses: Intact distal pulses.     Heart sounds: Normal heart sounds. No murmur heard.    No gallop.  Pulmonary:     Effort: Pulmonary effort is normal.     Breath sounds: Normal breath sounds.  Abdominal:     General:  Bowel sounds are normal.     Palpations: Abdomen is soft.  Musculoskeletal:     Right lower leg: No edema.     Left lower leg: No edema.    EKG:    EKG Interpretation Date/Time:  Tuesday August 24 2024 09:42:45 EDT Ventricular Rate:  55 PR Interval:  194 QRS Duration:  132 QT Interval:  432 QTC Calculation: 413 R Axis:   -67  Text Interpretation: EKG 08/24/2024: Sinus bradycardia at rate of 55 bpm, left anterior fascicular block.  IVCD, LVH.  Compared to 08/27/2023, no significant change. Confirmed by Falisa Lamora, Jagadeesh [phone]) on 08/24/2024 9:49:32 AM    ASSESSMENT AND PLAN: .      ICD-10-CM   1. Essential hypertension  I10 EKG 12-Lead    2. Palpitations  R00.2     3. 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 Currently asymptomatic from a cardiac standpoint. - Continue current beta blocker regimen.  Essential hypertension Blood pressure is well controlled at 132/60 mmHg. - Continue metoprolol  succinate 50 mg daily and amlodipine  10 mg daily.  Hyperlipidemia LDL cholesterol is well controlled at 69 mg/dL on statin therapy(Crestor  10 mg daily).  Chronic kidney disease, stage IIIa stage Slight increase in creatinine levels, possibly due to increased muscle mass from exercise. Kidney function remains good. - Check urinary protein to creatinine ratio. - Consider starting therapy such as Jardiance or an ACE inhibitor/ARB if urinary protein is high.  History of colonic polyps Scheduled for follow-up colonoscopy next year as part of routine surveillance due to family history of colon cancer. - No GI bleed.  Follow up: 1 year  Signed,  Gordy Bergamo, MD, [facility], [place] 08/24/2024, 1:24 PM [facility] [address] [place], [place] [address] Phone: [phone]. Fax:  [phone]

## 2024-08-24 NOTE — Patient Instructions (Signed)
 Medication Instructions:  NONE *If you need a refill on your cardiac medications before your next appointment, please call your pharmacy*  Lab Work: NONE If you have labs (blood work) drawn today and your tests are completely normal, you will receive your results only by: MyChart Message (if you have MyChart) OR A paper copy in the mail If you have any lab test that is abnormal or we need to change your treatment, we will call you to review the results.  Testing/Procedures: NONE  Follow-Up: At [facility], you and your health needs are our priority.  As part of our continuing mission to provide you with exceptional heart care, our providers are all part of one team.  This team includes your primary Cardiologist (physician) and Advanced Practice Providers or APPs (Physician Assistants and Nurse Practitioners) who all work together to provide you with the care you need, when you need it.  Your next appointment:   1 Year  Provider:   Gordy Bergamo, MD    We recommend signing up for the patient portal called MyChart.  Sign up information is provided on this After Visit Summary.  MyChart is used to connect with patients for Virtual Visits (Telemedicine).  Patients are able to view lab/test results, encounter notes, upcoming appointments, etc.  Non-urgent messages can be sent to your provider as well.   To learn more about what you can do with MyChart, go to [email or web address].

## 2024-08-26 ENCOUNTER — Ambulatory Visit: Admitting: Family Medicine

## 2024-08-26 ENCOUNTER — Encounter: Payer: Self-pay | Admitting: Family Medicine

## 2024-08-26 VITALS — BP 124/58 | HR 52 | Temp 98.4°F | Ht 67.87 in | Wt 192.4 lb

## 2024-08-26 DIAGNOSIS — Z7189 Other specified counseling: Secondary | ICD-10-CM

## 2024-08-26 DIAGNOSIS — E7800 Pure hypercholesterolemia, unspecified: Secondary | ICD-10-CM

## 2024-08-26 DIAGNOSIS — I10 Essential (primary) hypertension: Secondary | ICD-10-CM

## 2024-08-26 DIAGNOSIS — R221 Localized swelling, mass and lump, neck: Secondary | ICD-10-CM

## 2024-08-26 DIAGNOSIS — R4586 Emotional lability: Secondary | ICD-10-CM | POA: Diagnosis not present

## 2024-08-26 DIAGNOSIS — E875 Hyperkalemia: Secondary | ICD-10-CM

## 2024-08-26 DIAGNOSIS — Z0000 Encounter for general adult medical examination without abnormal findings: Secondary | ICD-10-CM

## 2024-08-26 LAB — BASIC METABOLIC PANEL WITH GFR
BUN: 13 mg/dL (ref 6–23)
CO2: 28 meq/L (ref 19–32)
Calcium: 8.9 mg/dL (ref 8.4–10.5)
Chloride: 105 meq/L (ref 96–112)
Creatinine, Ser: 1.1 mg/dL (ref 0.40–1.50)
GFR: 64.23 mL/min (ref >60.00)
Glucose, Bld: 82 mg/dL (ref 70–99)
Potassium: 4 meq/L (ref 3.5–5.1)
Sodium: 139 meq/L (ref 135–145)

## 2024-08-26 MED ORDER — ROSUVASTATIN CALCIUM 10 MG PO TABS: ORAL_TABLET | ORAL | 3 refills | Status: AC

## 2024-08-26 MED ORDER — BUPROPION HCL ER (XL) 150 MG PO TB24: 450.0000 mg | ORAL_TABLET | Freq: Every day | ORAL | 3 refills | Status: AC

## 2024-08-26 NOTE — Patient Instructions (Addendum)
Go to the lab on the way out.   If you have mychart we'll likely use that to update you.    °Take care.  Glad to see you. °Update me as needed.   °
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
## 2024-08-29 ENCOUNTER — Ambulatory Visit: Payer: Self-pay | Admitting: Family Medicine

## 2024-08-29 DIAGNOSIS — Z0000 Encounter for general adult medical examination without abnormal findings: Secondary | ICD-10-CM | POA: Insufficient documentation

## 2024-08-29 DIAGNOSIS — E875 Hyperkalemia: Secondary | ICD-10-CM | POA: Insufficient documentation

## 2024-08-29 NOTE — Assessment & Plan Note (Signed)
 Recheck K and Cr pending.  D/w pt.
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
## 2024-08-29 NOTE — Assessment & Plan Note (Signed)
 Labs discussed with patient.  Continue Crestor  twice a week.  He had cardiology follow-up in the meantime.
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
## 2024-08-29 NOTE — Assessment & Plan Note (Signed)
 Continue work on diet and exercise.  Continue metoprolol  and amlodipine .  Labs discussed.

## 2024-08-29 NOTE — Assessment & Plan Note (Signed)
 Awaiting follow-up imaging.

## 2024-09-02 ENCOUNTER — Ambulatory Visit
Admission: RE | Admit: 2024-09-02 | Discharge: 2024-09-02 | Disposition: A | Discharge status: Home / Self Care | Source: Ambulatory Visit | Attending: Family Medicine | Admitting: Family Medicine

## 2024-09-02 DIAGNOSIS — R221 Localized swelling, mass and lump, neck: Secondary | ICD-10-CM

## 2024-09-02 MED ORDER — GADOPICLENOL 0.5 MMOL/ML IV SOLN
9.0000 mL | Freq: Once | INTRAVENOUS | Status: AC | PRN
  Administered 2024-09-02: 9 mL via INTRAVENOUS

## 2024-09-03 ENCOUNTER — Telehealth: Payer: Self-pay

## 2024-09-03 NOTE — Telephone Encounter (Signed)
 Noted, awaiting the report.  Thanks.

## 2024-09-03 NOTE — Telephone Encounter (Signed)
 Received call from back door line. Patient wanted to let Dr. Cleatus know that he had his MRI last night.

## 2024-09-11 ENCOUNTER — Ambulatory Visit: Payer: Self-pay | Admitting: Family Medicine

## 2024-09-11 DIAGNOSIS — R221 Localized swelling, mass and lump, neck: Secondary | ICD-10-CM

## 2024-10-13 ENCOUNTER — Encounter (INDEPENDENT_AMBULATORY_CARE_PROVIDER_SITE_OTHER): Payer: Self-pay | Admitting: Otolaryngology

## 2024-10-13 ENCOUNTER — Ambulatory Visit (INDEPENDENT_AMBULATORY_CARE_PROVIDER_SITE_OTHER): Admitting: Otolaryngology

## 2024-10-13 VITALS — BP 136/52 | HR 50 | Temp 98.1°F | Ht 69.0 in | Wt 189.0 lb

## 2024-10-13 DIAGNOSIS — R591 Generalized enlarged lymph nodes: Secondary | ICD-10-CM | POA: Diagnosis not present

## 2024-10-13 DIAGNOSIS — K219 Gastro-esophageal reflux disease without esophagitis: Secondary | ICD-10-CM | POA: Diagnosis not present

## 2024-10-13 MED ORDER — PANTOPRAZOLE SODIUM 40 MG PO TBEC: 40.0000 mg | DELAYED_RELEASE_TABLET | Freq: Two times a day (BID) | ORAL | 1 refills | Status: AC
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
## 2024-10-28 ENCOUNTER — Other Ambulatory Visit: Payer: Self-pay | Admitting: Family Medicine

## 2024-10-30 ENCOUNTER — Other Ambulatory Visit: Payer: Self-pay | Admitting: Family Medicine

## 2024-12-15 ENCOUNTER — Ambulatory Visit (INDEPENDENT_AMBULATORY_CARE_PROVIDER_SITE_OTHER): Admitting: Otolaryngology

## 2025-07-05 ENCOUNTER — Ambulatory Visit

## 2025-07-06 ENCOUNTER — Ambulatory Visit
